# Patient Record
Sex: Female | Born: 1975 | Race: Black or African American | Hispanic: No | Marital: Single | Smoking: Never smoker
Health system: Southern US, Community
[De-identification: ages and names within clinical notes are randomized; demographics above are authoritative.]

## PROBLEM LIST (undated history)

## (undated) DIAGNOSIS — T7840XA Allergy, unspecified, initial encounter: Secondary | ICD-10-CM

## (undated) DIAGNOSIS — U071 COVID-19: Secondary | ICD-10-CM

## (undated) DIAGNOSIS — J45909 Unspecified asthma, uncomplicated: Secondary | ICD-10-CM

## (undated) DIAGNOSIS — R220 Localized swelling, mass and lump, head: Secondary | ICD-10-CM

## (undated) DIAGNOSIS — D649 Anemia, unspecified: Secondary | ICD-10-CM

## (undated) DIAGNOSIS — G709 Myoneural disorder, unspecified: Secondary | ICD-10-CM

## (undated) HISTORY — PX: CHOLECYSTECTOMY: SHX55

## (undated) HISTORY — DX: COVID-19: U07.1

## (undated) HISTORY — DX: Localized swelling, mass and lump, head: R22.0

## (undated) HISTORY — DX: Anemia, unspecified: D64.9

## (undated) HISTORY — DX: Allergy, unspecified, initial encounter: T78.40XA

## (undated) HISTORY — DX: Unspecified asthma, uncomplicated: J45.909

## (undated) HISTORY — PX: ABDOMINAL HYSTERECTOMY: SHX81

## (undated) HISTORY — PX: TUBAL LIGATION: SHX77

## (undated) HISTORY — DX: Myoneural disorder, unspecified: G70.9

---

## 2013-06-30 DIAGNOSIS — G709 Myoneural disorder, unspecified: Secondary | ICD-10-CM

## 2013-06-30 HISTORY — DX: Myoneural disorder, unspecified: G70.9

## 2015-05-17 DIAGNOSIS — B9689 Other specified bacterial agents as the cause of diseases classified elsewhere: Secondary | ICD-10-CM | POA: Insufficient documentation

## 2018-06-24 ENCOUNTER — Ambulatory Visit: Payer: Self-pay | Admitting: Family Medicine

## 2018-07-02 ENCOUNTER — Other Ambulatory Visit (HOSPITAL_COMMUNITY): Payer: Self-pay | Admitting: Orthopaedic Surgery

## 2018-07-02 ENCOUNTER — Encounter: Payer: Self-pay | Admitting: Family Medicine

## 2018-07-02 ENCOUNTER — Ambulatory Visit (INDEPENDENT_AMBULATORY_CARE_PROVIDER_SITE_OTHER): Payer: Medicaid Other | Admitting: Family Medicine

## 2018-07-02 ENCOUNTER — Ambulatory Visit (HOSPITAL_COMMUNITY)
Admission: RE | Admit: 2018-07-02 | Discharge: 2018-07-02 | Disposition: A | Discharge status: Home / Self Care | Payer: Medicaid Other | Source: Ambulatory Visit | Attending: Family Medicine | Admitting: Family Medicine

## 2018-07-02 VITALS — BP 120/73 | HR 73 | Temp 98.6°F | Resp 14 | Ht 67.5 in | Wt 165.0 lb

## 2018-07-02 DIAGNOSIS — Z23 Encounter for immunization: Secondary | ICD-10-CM | POA: Diagnosis not present

## 2018-07-02 DIAGNOSIS — M549 Dorsalgia, unspecified: Secondary | ICD-10-CM | POA: Diagnosis not present

## 2018-07-02 DIAGNOSIS — E663 Overweight: Secondary | ICD-10-CM | POA: Diagnosis not present

## 2018-07-02 DIAGNOSIS — E559 Vitamin D deficiency, unspecified: Secondary | ICD-10-CM

## 2018-07-02 DIAGNOSIS — M419 Scoliosis, unspecified: Secondary | ICD-10-CM

## 2018-07-02 DIAGNOSIS — M545 Low back pain, unspecified: Secondary | ICD-10-CM

## 2018-07-02 DIAGNOSIS — M62838 Other muscle spasm: Secondary | ICD-10-CM | POA: Diagnosis not present

## 2018-07-02 DIAGNOSIS — G8929 Other chronic pain: Secondary | ICD-10-CM | POA: Insufficient documentation

## 2018-07-02 DIAGNOSIS — D500 Iron deficiency anemia secondary to blood loss (chronic): Secondary | ICD-10-CM

## 2018-07-02 DIAGNOSIS — Z114 Encounter for screening for human immunodeficiency virus [HIV]: Secondary | ICD-10-CM

## 2018-07-02 MED ORDER — NAPROXEN 500 MG PO TABS: 500.0000 mg | ORAL_TABLET | Freq: Two times a day (BID) | ORAL | 2 refills | Status: DC

## 2018-07-02 NOTE — Progress Notes (Signed)
Patient [facility]  New Patient Encounter Provider: Lanae Boast, [place]    YQM:[id number]  BMW:[id number]  DOB - Jan 24, 1976  SUBJECTIVE:   Ketina Mars, is a 43 y.o. female who presents to establish care with this clinic.   Current problems/concerns:   Patient states that she has a history of muscle spasms in bilateral legs and hands. Is currently taking flexeril as needed for this.  She also states a history of anemia. Does not consistently take iron due to constipation.  Patient also with a history of low back pain. 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 She states that she has to lift and walk during her shift. Intermittently has tingling of her bilateral lower extremities.   Allergies  Allergen Reactions  . Onion    Past Medical History:  Diagnosis Date  . Anemia   . Neuromuscular disorder ([place]) 2015   bilateral legs and hands- spasms.    Current Outpatient Medications on File Prior to Visit  Medication Sig Dispense Refill  . cyclobenzaprine (FLEXERIL) 10 MG tablet Take 10 mg by mouth at bedtime.    Marland Kitchen ibuprofen (ADVIL,MOTRIN) 200 MG tablet Take 200 mg by mouth every 6 (six) hours as needed.    . ferrous sulfate 325 (65 FE) MG tablet Take 325 mg by mouth daily with breakfast.     No current facility-administered medications on file prior to visit.    Family History  Problem Relation Age of Onset  . Cancer Mother        cervical   . GER disease Mother   . Diabetes Brother   . Ovarian cysts Daughter   . ADD / ADHD Son    Social History   Socioeconomic History  . Marital status: Single    Spouse name: Not on file  . Number of children: 3  . Years of education: Not on file  . Highest education level: 8th grade  Occupational History  . Not on file  Social Needs  . Financial resource strain: Somewhat hard  . Food insecurity:    Worry: Sometimes true    Inability: Sometimes true  .  Transportation needs:    Medical: No    Non-medical: No  Tobacco Use  . Smoking status: Never Smoker  . Smokeless tobacco: Current User    Types: Snuff  Substance and Sexual Activity  . Alcohol use: Yes    Alcohol/week: 3.0 standard drinks    Types: 3 Cans of beer per week    Comment: everyday   . Drug use: Never  . Sexual activity: Yes    Birth control/protection: Post-menopausal  Lifestyle  . Physical activity:    Days per week: 0 days    Minutes per session: 0 min  . Stress: Not on file  Relationships  . Social connections:    Talks on phone: More than three times a week    Gets together: Not on file    Attends religious service: 1 to 4 times per year    Active member of club or organization: Not on file    Attends meetings of clubs or organizations: Not on file    Relationship status: Never married  . Intimate partner violence:    Fear of current or ex partner: No    Emotionally abused: No    Physically abused: No    Forced sexual activity: No  Other Topics Concern  . Not on file  Social History Narrative  . Not on file    Review of Systems  Constitutional: Positive for malaise/fatigue.  HENT: Negative.   Eyes: Negative.   Respiratory: Negative.   Cardiovascular: Negative.   Gastrointestinal: Negative.   Genitourinary: Negative.   Musculoskeletal: Positive for back pain and myalgias.  Skin: Negative.   Neurological: Negative.   Psychiatric/Behavioral: Negative.      OBJECTIVE:    BP 120/73 (BP Location: Left Arm, Patient Position: Sitting, Cuff Size: Normal)   Pulse 73   Temp 98.6 F (37 C) (Oral)   Resp 14   Ht 5' 7.5" (1.715 m)   Wt 165 lb (74.8 kg)   SpO2 100%   BMI 25.46 kg/m   Physical Exam  Constitutional: She is oriented to person, place, and time and well-developed, well-nourished, and in no distress. No distress.  HENT:  Head: Normocephalic and atraumatic.  Eyes: Pupils are equal, round, and reactive to light. Conjunctivae and EOM are  normal.  Neck: Normal range of motion. Neck supple.  Cardiovascular: Normal rate, regular rhythm and intact distal pulses. Exam reveals no gallop and no friction rub.  No murmur heard. Pulmonary/Chest: Effort normal and breath sounds normal. No respiratory distress. She has no wheezes.  Abdominal: Soft. Bowel sounds are normal. There is no abdominal tenderness.  Musculoskeletal: Normal range of motion.        General: No tenderness or edema.     Lumbar back: She exhibits bony tenderness. She exhibits normal range of motion. Deformity: curvature noted.        Back:  Lymphadenopathy:    She has no cervical adenopathy.  Neurological: She is alert and oriented to person, place, and time. Gait normal.  Skin: Skin is warm and dry.  Psychiatric: Mood, memory, affect and judgment normal.  Nursing note and vitals reviewed.    ASSESSMENT/PLAN:  1. Vitamin D deficiency Pending labs. Will adjust medications accordingly.    - VITAMIN D 25 Hydroxy (Vit-D Deficiency, Fractures)  2. Iron deficiency anemia due to chronic blood loss Pending labs. Will adjust medications accordingly.  - Iron, TIBC and Ferritin Panel  3. Overweight (BMI 25.0-29.9)  - Lipid Panel With LDL/HDL Ratio; Future  4. Muscle spasm Pending labs. Will adjust medications accordingly.  - CBC With Differential - Comprehensive metabolic panel - naproxen (NAPROSYN) 500 MG tablet; Take 1 tablet (500 mg total) by mouth 2 (two) times daily with a meal.  Dispense: 30 tablet; Refill: 2  5. Screening for HIV (human immunodeficiency virus) Health maintenance - HIV antibody (with reflex)    Return in about 6 months (around 12/31/2018), or if symptoms worsen or fail to improve.  The patient was given clear instructions to go to ER or return to medical center if symptoms don't improve, worsen or new problems develop. The patient verbalized understanding. The patient was told to call to get lab results if they haven't heard anything  in the next week.     This note has been created with Surveyor, quantity. Any transcriptional errors are unintentional.   Ms. Andr L. Nathaneil Canary, FNP-BC Patient [facility] [address] [place],  [address] [phone]

## 2018-07-02 NOTE — Patient Instructions (Signed)
Safe Sex Practicing safe sex means taking steps before and during sex to reduce your risk of:  Getting an STD (sexually transmitted disease).  Giving your partner an STD.  Unwanted pregnancy. How can I practice safe sex? To practice safe sex:  Limit your sexual partners to only one partner who is having sex with only you.  Avoid using alcohol and recreational drugs before having sex. These substances can affect your judgment.  Before having sex with a new partner: ? Talk to your partner about past partners, past STDs, and drug use. ? You and your partner should be screened for STDs and discuss the results with each other.  Check your body regularly for sores, blisters, rashes, or unusual discharge. If you notice any of these problems, visit your health care provider.  If you have symptoms of an infection or you are being treated for an STD, avoid sexual contact.  While having sex, use a condom. Make sure to: ? Use a condom every time you have vaginal, oral, or anal sex. Both females and males should wear condoms during oral sex. ? Keep condoms in place from the beginning to the end of sexual activity. ? Use a latex condom, if possible. Latex condoms offer the best protection. ? Use only water-based lubricants or oils to lubricate a condom. Using petroleum-based lubricants or oils will weaken the condom and increase the chance that it will break.  See your health care provider for regular screenings, exams, and tests for STDs.  Talk with your health care provider about the form of birth control (contraception) that is best for you.  Get vaccinated against hepatitis B and human papillomavirus (HPV).  If you are at risk of being infected with HIV (human immunodeficiency virus), talk with your health care provider about taking a prescription medicine to prevent HIV infection. You are considered at risk for HIV if: ? You are a man who has sex with other men. ? You are a heterosexual  man or woman who is sexually active with more than one partner. ? You take drugs by injection. ? You are sexually active with a partner who has HIV. This information is not intended to replace advice given to you by your health care provider. Make sure you discuss any questions you have with your health care provider. Document Released: 07/24/2004 Document Revised: 10/31/2015 Document Reviewed: 05/06/2015 Elsevier Interactive Patient Education  2019 [employer]. Leg Cramps Leg cramps occur when one or more muscles tighten and you have no control over this tightening (involuntary muscle contraction). Muscle cramps can develop in any muscle, but the most common place is in the calf muscles of the leg. Those cramps can occur during exercise or when you are at rest. Leg cramps are painful, and they may last for a few seconds to a few minutes. Cramps may return several times before they finally stop. Usually, leg cramps are not caused by a serious medical problem. In many cases, the cause is not known. Some common causes include:  Excessive physical effort (overexertion), such as during intense exercise.  Overuse from repetitive motions, or doing the same thing over and over.  Staying in a certain position for a long period of time.  Improper preparation, form, or technique while performing a sport or an activity.  Dehydration.  Injury.  Side effects of certain medicines.  Abnormally low levels of minerals in your blood (electrolytes), especially potassium and calcium. This could result from: ? Pregnancy. ? Taking diuretic medicines.  Follow these instructions at home: Eating and drinking  Drink enough fluid to keep your urine pale yellow. Staying hydrated may help prevent cramps.  Eat a healthy diet that includes plenty of nutrients to help your muscles function. A healthy diet includes fruits and vegetables, lean protein, whole grains, and low-fat or nonfat dairy products. Managing  pain, stiffness, and swelling      Try massaging, stretching, and relaxing the affected muscle. Do this for several minutes at a time.  If directed, put ice on areas that are sore or painful after a cramp: ? Put ice in a plastic bag. ? Place a towel between your skin and the bag. ? Leave the ice on for 20 minutes, 2-3 times a day.  If directed, apply heat to muscles that are tense or tight. Do this before you exercise, or as often as told by your health care provider. Use the heat source that your health care provider recommends, such as a moist heat pack or a heating pad. ? Place a towel between your skin and the heat source. ? Leave the heat on for 20-30 minutes. ? Remove the heat if your skin turns bright red. This is especially important if you are unable to feel pain, heat, or cold. You may have a greater risk of getting burned.  Try taking hot showers or baths to help relax tight muscles. General instructions  If you are having frequent leg cramps, avoid intense exercise for several days.  Take over-the-counter and prescription medicines only as told by your health care provider.  Keep all follow-up visits as told by your health care provider. This is important. Contact a health care provider if:  Your leg cramps get more severe or more frequent, or they do not improve over time.  Your foot becomes cold, numb, or blue. Summary  Muscle cramps can develop in any muscle, but the most common place is in the calf muscles of the leg.  Leg cramps are painful, and they may last for a few seconds to a few minutes.  Usually, leg cramps are not caused by a serious medical problem. Often, the cause is not known.  Stay hydrated and take over-the-counter and prescription medicines only as told by your health care provider. This information is not intended to replace advice given to you by your health care provider. Make sure you discuss any questions you have with your health care  provider. Document Released: 07/24/2004 Document Revised: 03/26/2017 Document Reviewed: 03/26/2017 Elsevier Interactive Patient Education  2019 [employer]. Iron Deficiency Anemia, Adult Iron-deficiency anemia is when you have a low amount of red blood cells or hemoglobin. This happens because you have too little iron in your body. Hemoglobin carries oxygen to parts of the body. Anemia can cause your body to not get enough oxygen. It may or may not cause symptoms. Follow these instructions at home: Medicines  Take over-the-counter and prescription medicines only as told by your doctor. This includes iron pills (supplements) and vitamins.  If you cannot handle taking iron pills by mouth, ask your doctor about getting iron through: ? A vein (intravenously). ? A shot (injection) into a muscle.  Take iron pills when your stomach is empty. If you cannot handle this, take them with food.  Do not drink milk or take antacids at the same time as your iron pills.  To prevent trouble pooping (constipation), eat fiber or take medicine (stool softener) as told by your doctor. Eating and drinking  Talk with your doctor before changing the foods you eat. He or she may tell you to eat foods that have a lot of iron, such as: ? Liver. ? Lowfat (lean) beef. ? Breads and cereals that have iron added to them (fortified breads and cereals). ? Eggs. ? Dried fruit. ? Dark green, leafy vegetables.  Drink enough fluid to keep your pee (urine) clear or pale yellow.  Eat fresh fruits and vegetables that are high in vitamin C. They help your body to use iron. Foods with a lot of vitamin C include: ? Oranges. ? Peppers. ? Tomatoes. ? Mangoes. General instructions  Return to your normal activities as told by your doctor. Ask your doctor what activities are safe for you.  Keep yourself clean, and keep things clean around you (your surroundings). Anemia can make you get sick more easily.  Keep all  follow-up visits as told by your doctor. This is important. Contact a doctor if:  You feel sick to your stomach (nauseous).  You throw up (vomit).  You feel weak.  You are sweating for no clear reason.  You have trouble pooping, such as: ? Pooping (having a bowel movement) less than 3 times a week. ? Straining to poop. ? Having poop that is hard, dry, or larger than normal. ? Feeling full or bloated. ? Pain in the lower belly. ? Not feeling better after pooping. Get help right away if:  You pass out (faint). If this happens, do not drive yourself to the hospital. Call your local emergency services (911 in the [place]  You have chest pain.  You have shortness of breath that: ? Is very bad. ? Gets worse with physical activity.  You have a fast heartbeat.  You get light-headed when getting up from sitting or lying down. This information is not intended to replace advice given to you by your health care provider. Make sure you discuss any questions you have with your health care provider. Document Released: 07/19/2010 Document Revised: 03/05/2016 Document Reviewed: 03/05/2016 Elsevier Interactive Patient Education  2019 [employer].

## 2018-07-03 LAB — IRON,TIBC AND FERRITIN PANEL
Ferritin: 81 ng/mL (ref 15–150)
Iron Saturation: 32 % (ref 15–55)
Iron: 108 ug/dL (ref 27–159)
Total Iron Binding Capacity: 336 ug/dL (ref 250–450)
UIBC: 228 ug/dL (ref 131–425)

## 2018-07-03 LAB — CBC WITH DIFFERENTIAL
Basophils Absolute: 0.1 10*3/uL (ref 0.0–0.2)
Basos: 1 %
EOS (ABSOLUTE): 0.3 10*3/uL (ref 0.0–0.4)
Eos: 7 %
Hematocrit: 34.1 % (ref 34.0–46.6)
Hemoglobin: 10.9 g/dL — ABNORMAL LOW (ref 11.1–15.9)
Immature Grans (Abs): 0 10*3/uL (ref 0.0–0.1)
Immature Granulocytes: 0 %
Lymphocytes Absolute: 1.9 10*3/uL (ref 0.7–3.1)
Lymphs: 38 %
MCH: 26.9 pg (ref 26.6–33.0)
MCHC: 32 g/dL (ref 31.5–35.7)
MCV: 84 fL (ref 79–97)
Monocytes Absolute: 0.2 10*3/uL (ref 0.1–0.9)
Monocytes: 5 %
Neutrophils Absolute: 2.4 10*3/uL (ref 1.4–7.0)
Neutrophils: 49 %
RBC: 4.05 x10E6/uL (ref 3.77–5.28)
RDW: 12.6 % (ref 12.3–15.4)
WBC: 4.8 10*3/uL (ref 3.4–10.8)

## 2018-07-03 LAB — LIPID PANEL WITH LDL/HDL RATIO
Cholesterol, Total: 204 mg/dL — ABNORMAL HIGH (ref 100–199)
HDL: 94 mg/dL (ref >39)
LDL Calculated: 90 mg/dL (ref 0–99)
LDl/HDL Ratio: 1 ratio (ref 0.0–3.2)
Triglycerides: 102 mg/dL (ref 0–149)
VLDL Cholesterol Cal: 20 mg/dL (ref 5–40)

## 2018-07-03 LAB — COMPREHENSIVE METABOLIC PANEL
ALT: 12 IU/L (ref 0–32)
AST: 15 IU/L (ref 0–40)
Albumin/Globulin Ratio: 1.9 (ref 1.2–2.2)
Albumin: 4.3 g/dL (ref 3.5–5.5)
Alkaline Phosphatase: 40 IU/L (ref 39–117)
BUN/Creatinine Ratio: 17 (ref 9–23)
BUN: 14 mg/dL (ref 6–24)
Bilirubin Total: <0.2 mg/dL (ref 0.0–1.2)
CO2: 23 mmol/L (ref 20–29)
Calcium: 8.7 mg/dL (ref 8.7–10.2)
Chloride: 104 mmol/L (ref 96–106)
Creatinine, Ser: 0.83 mg/dL (ref 0.57–1.00)
GFR calc Af Amer: 100 mL/min/{1.73_m2} (ref >59)
GFR calc non Af Amer: 87 mL/min/{1.73_m2} (ref >59)
Globulin, Total: 2.3 g/dL (ref 1.5–4.5)
Glucose: 84 mg/dL (ref 65–99)
Potassium: 4 mmol/L (ref 3.5–5.2)
Sodium: 141 mmol/L (ref 134–144)
Total Protein: 6.6 g/dL (ref 6.0–8.5)

## 2018-07-03 LAB — HIV ANTIBODY (ROUTINE TESTING W REFLEX): HIV Screen 4th Generation wRfx: NONREACTIVE

## 2018-07-03 LAB — VITAMIN D 25 HYDROXY (VIT D DEFICIENCY, FRACTURES): Vit D, 25-Hydroxy: 6.6 ng/mL — ABNORMAL LOW (ref 30.0–100.0)

## 2018-07-06 ENCOUNTER — Telehealth: Payer: Self-pay

## 2018-07-06 MED ORDER — VITAMIN D (ERGOCALCIFEROL) 1.25 MG (50000 UNIT) PO CAPS: 50000.0000 [IU] | ORAL_CAPSULE | ORAL | 0 refills | Status: AC

## 2018-07-06 NOTE — Addendum Note (Signed)
Addended by: Genelle Bal on: 07/06/2018 12:18 PM   Modules accepted: Orders

## 2018-07-06 NOTE — Telephone Encounter (Signed)
Patient has been notified via my-chart. Thanks!

## 2018-07-06 NOTE — Telephone Encounter (Signed)
I am sending this patient to physical therapy for further evaluation of scoliosis.

## 2018-07-06 NOTE — Telephone Encounter (Signed)
-----   Message from Lanae Boast, [place] sent at 07/06/2018 12:18 PM EST ----- Low Vitamin D. All other labs are stable. I will send Vit D to the pharmacy. After completion of the prescription, patient will need otc vitamin D of 1,000units daily.

## 2018-07-06 NOTE — Addendum Note (Signed)
Addended by: Genelle Bal on: 07/06/2018 01:23 PM   Modules accepted: Orders

## 2018-07-06 NOTE — Telephone Encounter (Signed)
Called, no answer. Left a message for patient to call back to our office. Thanks!  

## 2018-07-20 ENCOUNTER — Other Ambulatory Visit: Payer: Self-pay

## 2018-07-20 ENCOUNTER — Ambulatory Visit: Payer: Medicaid Other | Attending: Orthopaedic Surgery | Admitting: Physical Therapy

## 2018-07-20 ENCOUNTER — Encounter: Payer: Self-pay | Admitting: Physical Therapy

## 2018-07-20 DIAGNOSIS — M545 Low back pain, unspecified: Secondary | ICD-10-CM

## 2018-07-20 DIAGNOSIS — M4126 Other idiopathic scoliosis, lumbar region: Secondary | ICD-10-CM | POA: Diagnosis not present

## 2018-07-20 DIAGNOSIS — M62838 Other muscle spasm: Secondary | ICD-10-CM | POA: Diagnosis not present

## 2018-07-20 DIAGNOSIS — G8929 Other chronic pain: Secondary | ICD-10-CM | POA: Diagnosis not present

## 2018-07-20 NOTE — Therapy (Signed)
[place], [place], [address] Phone: [phone]   Fax:  [phone]  Physical Therapy Evaluation  Patient Details  Name: Melanie Ortiz MRN: [id number] Date of Birth: 1975-07-04 Referring Provider (PT): Lanae Boast, FNP   Encounter Date: 07/20/2018  PT End of Session - 07/20/18 1632    Visit Number  1    Number of Visits  4    Date for PT Re-Evaluation  08/17/18    Authorization Type  Medicaid    PT Start Time  6644    PT Stop Time  1615    PT Time Calculation (min)  45 min    Activity Tolerance  Patient tolerated treatment well    Behavior During Therapy  [facility] for tasks assessed/performed       Past Medical History:  Diagnosis Date  . Anemia   . Neuromuscular disorder ([place]) 2015   bilateral legs and hands- spasms.     Past Surgical History:  Procedure Laterality Date  . ABDOMINAL HYSTERECTOMY     04/25/2015    There were no vitals filed for this visit.   Subjective Assessment - 07/20/18 1531    Subjective  Pt. reports approximately 2 year history of insidious onset mildine lumbar pain. Additionally pt. reports approximately 3 year history of issues with muscle spasms in bilat. thighs and feet exacerbated in particularly with exposure to cold. No LE radiating pain from back but pt. also reports interimittent burning sensation in bilat. feet worse on right side. Pt. had X-rays of lumbar spine 07/02/18 which per report revealed 10 deg lumbar levoscoliosis. Pt. also c/o that her front thigh muscles feel tight with association with muscle spasms.    Pertinent History  2 year history of symptoms with muscle spasm issues as noted above, underlying mild lumbar scoliosis    Limitations  Standing;Walking;House hold activities    How long can you sit comfortably?  1 hour is chair is sufficiently high    How long can you stand comfortably?  1 hour    How long can you walk comfortably?  1 hour    Diagnostic  tests  X-rays    Patient Stated Goals  get back to stop hurting, help muscles loosen up in legs    Currently in Pain?  Yes    Pain Score  5     Pain Location  Back    Pain Orientation  Lower   midline   Pain Descriptors / Indicators  Sharp    Pain Type  Chronic pain    Pain Radiating Towards  pt. reports intermittent burning in left>right foot but local to feet, does not radiate from back    Pain Onset  More than a month ago    Pain Frequency  Intermittent    Aggravating Factors   prolonged walking and standing on hard floor, supine positioning, sitting in low chairs    Pain Relieving Factors  bending/trunk ROM, standing "straight up"    Effect of Pain on Daily Activities  limits tolerance for standing/walking at work, limits positional tolerance for supine positioning, sitting         [facility] PT Assessment - 07/20/18 0001      Assessment   Medical Diagnosis  Chronic LBP, scoliosis, muscle spasm    Referring Provider (PT)  Lanae Boast, FNP    Onset Date/Surgical Date  07/20/16   estimated per pt. report 2 year history of LBP   Hand Dominance  Right  Prior Therapy  none      Precautions   Precautions  None      Restrictions   Weight Bearing Restrictions  No      Balance Screen   Has the patient fallen in the past 6 months  No      [place] residence    Living Arrangements  Children    Type of [place] Access  --   no stairs     Prior Function   Level of Independence  Independent with basic ADLs      Cognition   Overall Cognitive Status  Within Functional Limits for tasks assessed      Sensation   Light Touch  --   grossly intact bilat. L2-S2 dermatomes   Additional Comments  Bilat. patellar and Achilles reflexes 2+/WNL      Posture/Postural Control   Posture Comments  elevated left iliac crest      ROM / Strength   AROM / PROM / Strength  AROM;Strength      AROM   Overall AROM Comments  Hip AROM/PROM  grossly WFL bilat.    AROM Assessment Site  Lumbar    Lumbar Flexion  90    Lumbar Extension  20    Lumbar - Right Side Bend  28    Lumbar - Left Side Bend  30    Lumbar - Right Rotation  WFL    Lumbar - Left Rotation  [facility]      Strength   Strength Assessment Site  Hip;Knee;Ankle    Right/Left Hip  Right;Left    Right Hip Flexion  5/5    Right Hip Extension  4/5    Right Hip External Rotation   4/5    Right Hip Internal Rotation  4+/5    Right Hip ABduction  4+/5    Left Hip Extension  4/5    Left Hip External Rotation  5/5    Left Hip Internal Rotation  5/5    Left Hip ABduction  4+/5    Right/Left Knee  Right;Left    Right Knee Flexion  5/5    Right Knee Extension  4+/5    Left Knee Flexion  5/5    Left Knee Extension  4+/5    Right/Left Ankle  Right;Left    Right Ankle Dorsiflexion  4+/5    Right Ankle Inversion  5/5    Right Ankle Eversion  5/5    Left Ankle Dorsiflexion  5/5    Left Ankle Inversion  5/5    Left Ankle Eversion  4+/5      Flexibility   Soft Tissue Assessment /Muscle Length  --   SLR 80 deg bilat. with mild hamstring tightness     Palpation   Palpation comment  Very tight with associated muscle tenderness bilat. lumbar paraspinals L4-5 region, tight left>right QL      Special Tests   Other special tests  --   SLR (-) bilat., Thomas test (+) for quad tightness bilat.               Objective measurements completed on examination: See above findings.              PT Education - 07/20/18 1630    Education Details  HEP, POC, spine anatomy, potential symptom etiology, roller use for thigh muscles to address muscle tightness    Person(s) Educated  Patient  Methods  Explanation;Demonstration;Verbal cues;Tactile cues;Handout    Comprehension  Verbal cues required;Tactile cues required;Returned demonstration;Verbalized understanding          PT Long Term Goals - 07/20/18 1633      PT LONG TERM GOAL #1   Title  Independent  with HEP    Baseline  no HEP    Time  4    Period  Weeks    Status  New    Target Date  08/17/18      PT LONG TERM GOAL #2   Title  Tolerate supine positioning for periods at least 10-15 minutes to work towards decreased sleep disturbance due to LBP    Baseline  unable/has difficulty tolerating    Time  4    Period  Weeks    Status  New    Target Date  08/17/18      PT LONG TERM GOAL #3   Title  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 presents with 2 year history chronic LBP. Posturally she has elevated left hip crest which suspect could be associated with scoliosis and as a contributing factor to muscle tightness. Otherwise general tightness/spasm lumbar paraspinal muscles. Tight quads but unable otherwise to correlate c/o thigh and feet muscle spasms directly to LBP. Also unclear etiology c/o burning in feet as there are no radiating symptoms from back. No PMH diabetes or other issue that would relate to neuropathy. Pt. would benefit from PT to help improve pain and address associated functional limitations.    History and Personal Factors relevant to plan of care:  2 year history symptoms, unclear etiology muscle spasms and c/o burning in feet    Clinical Presentation  Evolving    Clinical Presentation due to:  unclear etiology LE muscle spasms and burning sensation in feet    Clinical Decision Making  Moderate    Rehab Potential  Fair    Clinical Impairments Affecting Rehab Potential  chronic symptom history    PT Frequency  --   eval + 3 treatment visits   PT Duration  4 weeks    PT Treatment/Interventions  ADLs/Self Care Home Management;Ultrasound;Traction;Moist Heat;Cryotherapy;Electrical Stimulation;Therapeutic activities;Therapeutic exercise;Neuromuscular re-education;Manual techniques;Dry  needling;Taping;Patient/family education    PT Next Visit Plan  Review HEP as needed, add left QL stretch if able, mat-based lumbar ROM and core strengthening, hamstring stretches, STM lumbar paraspinals    PT Home Exercise Plan  child's pose with sidebend to address QL tightness, pelvic tilt, LTR, hip bridge, quad stretch for pt. c/o quad tightness    Consulted and Agree with Plan of Care  Patient       Patient will benefit from skilled therapeutic intervention in order to improve the following deficits and impairments:  Pain, Postural dysfunction, Increased muscle spasms, Impaired flexibility, Difficulty walking, Decreased activity tolerance, Decreased endurance, Decreased range of motion, Decreased strength  Visit Diagnosis: Chronic midline low back pain without sciatica  Other idiopathic scoliosis, lumbar region  Other muscle spasm     Problem List There are no active problems to display for this patient.   Beaulah Dinning, PT, DPT 07/20/18 5:42 PM  [place]  [place], [place], [address] Phone: [phone]   Fax:  [phone]  Name: Melanie Ortiz MRN: [id number] Date of Birth: 12/03/1975

## 2018-07-28 ENCOUNTER — Encounter: Payer: Self-pay | Admitting: Physical Therapy

## 2018-07-28 ENCOUNTER — Ambulatory Visit: Payer: Medicaid Other | Admitting: Physical Therapy

## 2018-07-28 DIAGNOSIS — G8929 Other chronic pain: Secondary | ICD-10-CM

## 2018-07-28 DIAGNOSIS — M545 Low back pain: Principal | ICD-10-CM

## 2018-07-28 DIAGNOSIS — M62838 Other muscle spasm: Secondary | ICD-10-CM | POA: Diagnosis not present

## 2018-07-28 DIAGNOSIS — M4126 Other idiopathic scoliosis, lumbar region: Secondary | ICD-10-CM | POA: Diagnosis not present

## 2018-07-28 NOTE — Patient Instructions (Signed)
Hamstring Stretch, Reclined (Strap, Doorframe)    Lengthen bottom leg on floor. Extend top leg along edge of doorframe or press foot up into yoga strap. Hold for ___3_ breaths. Repeat ___3_ times each leg.  Copyright  VHI. All rights reserved.

## 2018-07-28 NOTE — Therapy (Signed)
[place], [place], [address] Phone: [phone]   Fax:  [phone]  Physical Therapy Treatment  Patient Details  Name: Melanie Ortiz MRN: [id number] Date of Birth: 1975-09-03 Referring Provider (PT): Lanae Boast, FNP   Encounter Date: 07/28/2018  PT End of Session - 07/28/18 0901    Visit Number  2    Number of Visits  4    Date for PT Re-Evaluation  08/17/18    Authorization Type  Medicaid    Authorization Time Period  1/28 to 08/16/18    Authorization - Visit Number  1    Authorization - Number of Visits  3    PT Start Time  0850    PT Stop Time  0930    PT Time Calculation (min)  40 min    Activity Tolerance  Patient tolerated treatment well    Behavior During Therapy  [facility] for tasks assessed/performed       Past Medical History:  Diagnosis Date  . Anemia   . Neuromuscular disorder ([place]) 2015   bilateral legs and hands- spasms.     Past Surgical History:  Procedure Laterality Date  . ABDOMINAL HYSTERECTOMY     04/25/2015    There were no vitals filed for this visit.  Subjective Assessment - 07/28/18 0853    Subjective  I just have to be careful how I move.  No new complaints.  Feet still burn with walking. Showed me how her quads have a bump, divet in them.     Currently in Pain?  Yes    Pain Score  3     Pain Location  Back    Pain Orientation  Lower    Pain Descriptors / Indicators  Aching    Pain Type  Chronic pain    Pain Onset  More than a month ago    Pain Frequency  Intermittent    Aggravating Factors   changing positions, walking, standing     Pain Relieving Factors  repositioning             [facility] Adult PT Treatment/Exercise - 07/28/18 0001      Self-Care   Self-Care  Other Self-Care Comments    Other Self-Care Comments   HEP correction, rationale for stretching       Lumbar Exercises: Stretches   Active Hamstring Stretch  3 reps;30 seconds    Single Knee to  Chest Stretch  3 reps;30 seconds    Lower Trunk Rotation  10 seconds    Lower Trunk Rotation Limitations  x 10     Pelvic Tilt  10 reps    Quad Stretch  3 reps    Quad Stretch Limitations  standing    Gastroc Stretch  2 reps    Gastroc Stretch Limitations  slant board       Lumbar Exercises: Aerobic   Nustep  UE and LE for 7 min L5       Lumbar Exercises: Supine   Bridge  10 reps    Bridge Limitations  pain at first , small ROM         PT Long Term Goals - 07/28/18 0916      PT LONG TERM GOAL #1   Title  Independent with HEP    Status  On-going      PT LONG TERM GOAL #2   Title  Tolerate supine positioning for periods at least 10-15 minutes to work towards decreased sleep  disturbance due to LBP    Status  On-going      PT LONG TERM GOAL #3   Title  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  Worked on effectively stretching to relieve pain and improve ability to squat, work .     PT Treatment/Interventions  ADLs/Self Care Home Management;Ultrasound;Traction;Moist Heat;Cryotherapy;Electrical Stimulation;Therapeutic activities;Therapeutic exercise;Neuromuscular re-education;Manual techniques;Dry needling;Taping;Patient/family education    PT Next Visit Plan  add left QL stretch if able, mat-based lumbar ROM and core strengthening, hamstring stretches, STM lumbar paraspinals    PT Home Exercise Plan  child's pose with sidebend to address QL tightness, pelvic tilt, LTR, hip bridge, quad stretch for pt. c/o quad tightness    Consulted and Agree with Plan of Care  Patient       Patient will benefit from skilled therapeutic intervention in order to improve the following deficits and impairments:  Pain, Postural dysfunction, Increased muscle spasms, Impaired flexibility, Difficulty walking, Decreased activity  tolerance, Decreased endurance, Decreased range of motion, Decreased strength  Visit Diagnosis: Chronic midline low back pain without sciatica  Other idiopathic scoliosis, lumbar region  Other muscle spasm     Problem List There are no active problems to display for this patient.   Melanie Ortiz 07/28/2018, 9:40 AM  [place], [place], [address] Phone: [phone]   Fax:  [phone]  Name: Melanie Ortiz MRN: [id number] Date of Birth: [birth year]  Raeford Razor, PT 07/28/18 9:40 AM Phone: [phone] Fax: [phone]

## 2018-08-03 ENCOUNTER — Encounter: Payer: Self-pay | Admitting: Physical Therapy

## 2018-08-03 ENCOUNTER — Ambulatory Visit: Payer: Medicaid Other | Attending: Orthopaedic Surgery | Admitting: Physical Therapy

## 2018-08-03 DIAGNOSIS — M545 Low back pain: Secondary | ICD-10-CM | POA: Diagnosis not present

## 2018-08-03 DIAGNOSIS — G8929 Other chronic pain: Secondary | ICD-10-CM

## 2018-08-03 DIAGNOSIS — M4126 Other idiopathic scoliosis, lumbar region: Secondary | ICD-10-CM | POA: Diagnosis not present

## 2018-08-03 DIAGNOSIS — M62838 Other muscle spasm: Secondary | ICD-10-CM

## 2018-08-03 NOTE — Therapy (Signed)
[place], [place], [address] Phone: [phone]   Fax:  [phone]  Physical Therapy Treatment  Patient Details  Name: Melanie Ortiz MRN: [id number] Date of Birth: 1975-10-28 Referring Provider (PT): Lanae Boast, FNP   Encounter Date: 08/03/2018  PT End of Session - 08/03/18 1547    Visit Number  3    Number of Visits  4    Date for PT Re-Evaluation  08/17/18    Authorization Type  Medicaid    Authorization Time Period  1/28 to 08/16/18    Authorization - Visit Number  2    Authorization - Number of Visits  3    PT Start Time  1093    PT Stop Time  1624    PT Time Calculation (min)  41 min    Activity Tolerance  Patient tolerated treatment well    Behavior During Therapy  [facility] for tasks assessed/performed       Past Medical History:  Diagnosis Date  . Anemia   . Neuromuscular disorder ([place]) 2015   bilateral legs and hands- spasms.     Past Surgical History:  Procedure Laterality Date  . ABDOMINAL HYSTERECTOMY     04/25/2015    There were no vitals filed for this visit.  Subjective Assessment - 08/03/18 1545    Subjective  Pt. reports sharp pain in lower back last night-she attributes with possible association with sitting posture, pain not as bad with standing.    Currently in Pain?  Yes    Pain Score  3     Pain Location  Back    Pain Orientation  Lower    Pain Descriptors / Indicators  Sharp    Pain Type  Chronic pain   with exacerbation yesterday   Pain Onset  More than a month ago    Pain Frequency  Intermittent    Aggravating Factors   sitting    Pain Relieving Factors  standing, position changes    Effect of Pain on Daily Activities  limits positional tolerance for sitting                       [facility] Adult PT Treatment/Exercise - 08/03/18 0001      Lumbar Exercises: Stretches   Passive Hamstring Stretch  Right;Left;2 reps;30 seconds    Single Knee to Chest  Stretch  Right;Left;3 reps;30 seconds    Double Knee to Chest Stretch  --   x15 AAROM with 55 cm ball   Lower Trunk Rotation  --   x 10 reps ea. way   Piriformis Stretch  Right;Left;3 reps;20 seconds    Gastroc Stretch  3 reps;20 seconds    Gastroc Stretch Limitations  slant board    Other Lumbar Stretch Exercise  Right QL/trunk stretch manually assisted in left sidelying over pillow roll, RLE off edge of mat 3x30 sec      Lumbar Exercises: Aerobic   Nustep  UE/LE x 6 min L6      Lumbar Exercises: Supine   Pelvic Tilt  15 reps    Bent Knee Raise  10 reps    Bent Knee Raise Limitations  with PPT    Bridge  15 reps    Bridge Limitations  partial bridge, able to tolerated without increased pain    Other Supine Lumbar Exercises  hip add. isometric with small ball 3-5 sec x 15 reps      Manual Therapy  Manual Therapy  Soft tissue mobilization    Soft tissue mobilization  STM upper lumbar paraspinals in left sidelying             PT Education - 08/03/18 1548    Education Details  sitting posture, POC    Person(s) Educated  Patient    Methods  Explanation;Demonstration;Verbal cues    Comprehension  Verbalized understanding;Returned demonstration          PT Long Term Goals - 07/28/18 0916      PT LONG TERM GOAL #1   Title  Independent with HEP    Status  On-going      PT LONG TERM GOAL #2   Title  Tolerate supine positioning for periods at least 10-15 minutes to work towards decreased sleep disturbance due to LBP    Status  On-going      PT LONG TERM GOAL #3   Title  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 Suspect postural/muscular contribution to current symptoms-reviewed sitting posture to help address. Expect progress will be gradual given chronicity of symptoms. Still  unclear etiology c/o diffuse cramping symptoms.    Rehab Potential  Fair    Clinical Impairments Affecting Rehab Potential  chronic symptom history    PT Frequency  --   eval + 3 visits   PT Duration  4 weeks    PT Treatment/Interventions  ADLs/Self Care Home Management;Ultrasound;Traction;Moist Heat;Cryotherapy;Electrical Stimulation;Therapeutic activities;Therapeutic exercise;Neuromuscular re-education;Manual techniques;Dry needling;Taping;Patient/family education    PT Next Visit Plan  flexion bias lumbar ROM and core strengthening, stretches, STM lumbar paraspinals    PT Home Exercise Plan  child's pose with sidebend to address QL tightness, pelvic tilt, LTR, hip bridge, quad stretch for pt. c/o quad tightness    Consulted and Agree with Plan of Care  Patient       Patient will benefit from skilled therapeutic intervention in order to improve the following deficits and impairments:  Pain, Postural dysfunction, Increased muscle spasms, Impaired flexibility, Difficulty walking, Decreased activity tolerance, Decreased endurance, Decreased range of motion, Decreased strength  Visit Diagnosis: Chronic midline low back pain without sciatica  Other idiopathic scoliosis, lumbar region  Other muscle spasm     Problem List There are no active problems to display for this patient.   Beaulah Dinning, PT, DPT 08/03/18 4:47 PM  [facility] [address] [place], [place], [address] Phone: [phone]   Fax:  [phone]  Name: Melanie Ortiz MRN: [id number] Date of Birth: 04-30-1976

## 2018-08-11 ENCOUNTER — Encounter: Payer: Self-pay | Admitting: Physical Therapy

## 2018-08-11 ENCOUNTER — Ambulatory Visit: Payer: Medicaid Other | Admitting: Physical Therapy

## 2018-08-11 DIAGNOSIS — G8929 Other chronic pain: Secondary | ICD-10-CM

## 2018-08-11 DIAGNOSIS — M62838 Other muscle spasm: Secondary | ICD-10-CM

## 2018-08-11 DIAGNOSIS — M545 Low back pain: Secondary | ICD-10-CM | POA: Diagnosis not present

## 2018-08-11 DIAGNOSIS — M4126 Other idiopathic scoliosis, lumbar region: Secondary | ICD-10-CM

## 2018-08-11 NOTE — Therapy (Addendum)
[place], [place], [address] Phone: [phone]   Fax:  [phone]  Physical Therapy Treatment/Discharge  Patient Details  Name: Melanie Ortiz MRN: [id number] Date of Birth: [birth year] Referring Provider (PT): Lanae Boast, FNP   Encounter Date: 08/11/2018  PT End of Session - 08/11/18 1254    Visit Number  4    Number of Visits  4    Date for PT Re-Evaluation  08/17/18    Authorization Type  Medicaid    Authorization Time Period  1/28 to 08/16/18    Authorization - Visit Number  3    Authorization - Number of Visits  3    PT Start Time  7124    PT Stop Time  1226    PT Time Calculation (min)  38 min    Activity Tolerance  Patient tolerated treatment well    Behavior During Therapy  [facility] for tasks assessed/performed       Past Medical History:  Diagnosis Date  . Anemia   . Neuromuscular disorder ([place]) 2015   bilateral legs and hands- spasms.     Past Surgical History:  Procedure Laterality Date  . ABDOMINAL HYSTERECTOMY     04/25/2015    There were no vitals filed for this visit.  Subjective Assessment - 08/11/18 1150    Subjective  Pt. rates improvement for LBP at 60% from baseline. She continues to have difficulty tolerating supine positioning but has been able to modify to use cushion under legs (with improved tolerance). No LBP pre-tx. but reports had pain the last 2 days. Pt. reports that she has been experiencing right shoulder pain and is interested in therapy for this. She reports approximately 2 year history of pain but states symptoms have been worse the past week without specific mechanism of injury. Discussed would need PT referral to include shoulder-pt. will follow up with referring provider for assessment of shoulder pain/referral as needed.    Pertinent History  2 year history of symptoms with muscle spasm issues as noted above, underlying mild lumbar scoliosis    Limitations   Standing;Walking;House hold activities    How long can you sit comfortably?  1 hour is chair is sufficiently high    How long can you stand comfortably?  varies, about an hour at work    How long can you walk comfortably?  varies, about an hour at work    Diagnostic tests  X-rays    Patient Stated Goals  get back to stop hurting, help muscles loosen up in legs    Currently in Pain?  Yes    Pain Score  6     Pain Location  Shoulder    Pain Orientation  Right    Pain Descriptors / Indicators  Sharp    Pain Type  --   chronic pain with exacerbation the past week   Pain Onset  --   1 week exacerbation of chronic pain   Pain Frequency  Intermittent    Aggravating Factors   reaching, activity    Pain Relieving Factors  rest    Effect of Pain on Daily Activities  limits ability for reaching activities, lifting         [facility] PT Assessment - 08/11/18 0001      AROM   Lumbar Flexion  90    Lumbar Extension  20    Lumbar - Right Side Bend  26    Lumbar - Left Side Bend  30    Lumbar - Right Rotation  WFL    Lumbar - Left Rotation  [facility]      Strength   Right Hip Flexion  5/5    Right Hip Extension  4+/5    Right Hip External Rotation   5/5    Right Hip Internal Rotation  5/5    Right Hip ABduction  4+/5    Left Hip Flexion  5/5    Left Hip Extension  4+/5    Left Hip External Rotation  5/5    Left Hip Internal Rotation  5/5    Left Hip ABduction  4+/5    Right Knee Flexion  5/5    Right Knee Extension  4+/5    Left Knee Flexion  5/5    Left Knee Extension  5/5    Right Ankle Dorsiflexion  5/5    Right Ankle Inversion  5/5    Right Ankle Eversion  5/5    Left Ankle Dorsiflexion  5/5    Left Ankle Inversion  5/5    Left Ankle Eversion  5/5                   [facility] Adult PT Treatment/Exercise - 08/11/18 0001      Lumbar Exercises: Stretches   Passive Hamstring Stretch  Right;Left;3 reps;30 seconds    Single Knee to Chest Stretch  Right;Left;3 reps;20 seconds       Lumbar Exercises: Aerobic   Nustep  UE/LE L5 x 5 min      Lumbar Exercises: Supine   Pelvic Tilt  15 reps    Bent Knee Raise  15 reps    Bridge  20 reps    Bridge Limitations  legs on bolster    Other Supine Lumbar Exercises  hip add. isometric with ball 3-5 sec holds 2x10    Other Supine Lumbar Exercises  clamshell green band 2x10      Manual Therapy   Manual Therapy  Soft tissue mobilization    Soft tissue mobilization  STM lumbar paraspinals in right sidelying             PT Education - 08/11/18 1253    Education Details  POC, need PT referral to assess shoulder    Person(s) Educated  Patient    Methods  Explanation    Comprehension  Verbalized understanding          PT Long Term Goals - 08/11/18 1301      PT LONG TERM GOAL #1   Title  Independent with HEP    Baseline  met    Time  4    Period  Weeks    Status  Achieved      PT LONG TERM GOAL #2   Title  Tolerate supine positioning for periods at least 10-15 minutes to work towards decreased sleep disturbance due to LBP    Baseline  unable if legs extended but can tolerate if legs are propped/supported with cushion    Time  4    Period  Weeks    Status  Partially Met      PT LONG TERM GOAL #3   Title  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 has attended 4 therapy visits (eval + 3 treatment sessions) with 60% subjective improvement in LBP symptoms which would consider good response given 2 year history of symptoms prior to therapy. Her LE and core strength as well as postural awareness have been improving from baseline. Still unclear etiology of c/o burning sensation in feet and spasms in feet as symptoms due not clinically appear radicular. Given improvement to date as well as further functional limitations to address with therapy plan  continue therapy for LBP, however will await status for PT referral for shoulder and if received re-evaluate to include in plan of care and request new therapy treatment authorization at next appointment.    History and Personal Factors relevant to plan of care:  2 year history symptoms, unclear etiology muscle spasms and c/o burning sensation in feet    Clinical Presentation  Evolving    Clinical Presentation due to:  unclear etiology LE muscle spasms and burning sensation in feet    Clinical Decision Making  Moderate    Rehab Potential  Fair    Clinical Impairments Affecting Rehab Potential  chronic symptom history    PT Frequency  --   eval + 3 tx. visits (will set new plan of care at re-evaluation if receiving PT referral for shoulder)   PT Duration  4 weeks    PT Treatment/Interventions  ADLs/Self Care Home Management;Ultrasound;Traction;Moist Heat;Cryotherapy;Electrical Stimulation;Therapeutic activities;Therapeutic exercise;Neuromuscular re-education;Manual techniques;Dry needling;Taping;Patient/family education    PT Next Visit Plan  flexion bias lumbar ROM and core strengthening, stretches, STM lumbar paraspinals    PT Home Exercise Plan  child's pose with sidebend to address QL tightness, pelvic tilt, LTR, hip bridge, quad stretch for pt. c/o quad tightness    Consulted and Agree with Plan of Care  Patient       Patient will benefit from skilled therapeutic intervention in order to improve the following deficits and impairments:  Pain, Postural dysfunction, Increased muscle spasms, Impaired flexibility, Difficulty walking, Decreased activity tolerance, Decreased endurance, Decreased range of motion, Decreased strength  Visit Diagnosis: Chronic midline low back pain without sciatica  Other idiopathic scoliosis, lumbar region  Other muscle spasm     Problem List There are no active problems to display for this patient.     PHYSICAL THERAPY DISCHARGE SUMMARY  Visits from  Start of Care: 4  Current functional level related to goals / functional outcomes: Status unknown, patient did not return for further therapy after last visit 08/11/18   Remaining deficits: Unknown   Education / Equipment: NA Plan: Patient agrees to discharge.  Patient goals were partially met. Patient is being discharged due to not returning since the last visit.  ?????          Beaulah Dinning, PT, DPT 09/08/18 1:21 PM    [facility] [address] [place], [place], [address] Phone: [phone]   Fax:  [phone]  Name: Melanie Ortiz MRN: [id number] Date of Birth: 31-Jan-1976

## 2018-08-25 ENCOUNTER — Encounter: Payer: Self-pay | Admitting: Family Medicine

## 2018-08-25 ENCOUNTER — Ambulatory Visit (INDEPENDENT_AMBULATORY_CARE_PROVIDER_SITE_OTHER): Payer: Medicaid Other | Admitting: Family Medicine

## 2018-08-25 VITALS — BP 124/77 | HR 64 | Temp 98.0°F | Ht 67.5 in | Wt 166.2 lb

## 2018-08-25 DIAGNOSIS — R220 Localized swelling, mass and lump, head: Secondary | ICD-10-CM | POA: Diagnosis not present

## 2018-08-25 DIAGNOSIS — Z09 Encounter for follow-up examination after completed treatment for conditions other than malignant neoplasm: Secondary | ICD-10-CM

## 2018-08-25 DIAGNOSIS — T7840XA Allergy, unspecified, initial encounter: Secondary | ICD-10-CM | POA: Diagnosis not present

## 2018-08-25 MED ORDER — PREDNISONE 10 MG PO TABS: ORAL_TABLET | ORAL | 0 refills | Status: DC

## 2018-08-25 MED ORDER — METHYLPREDNISOLONE SODIUM SUCC 125 MG IJ SOLR
125.0000 mg | Freq: Once | INTRAMUSCULAR | Status: AC
  Administered 2018-08-25: 125 mg via INTRAMUSCULAR

## 2018-08-25 MED ORDER — LEVOCETIRIZINE DIHYDROCHLORIDE 5 MG PO TABS: 5.0000 mg | ORAL_TABLET | Freq: Every evening | ORAL | 3 refills | Status: DC

## 2018-08-25 NOTE — Progress Notes (Signed)
Patient [facility]  Sick Visit  Subjective:  Patient ID: Melanie Ortiz, female    DOB: 1975/10/20  Age: 43 y.o. MRN: [id number]  CC:  Chief Complaint  Patient presents with  . Facial Swelling    SWOLLEN EYES    HPI Melanie Ortiz is a 43 year old female who presents for a Sick Visit.   Past Medical History:  Diagnosis Date  . Allergic reaction   . Anemia   . Facial swelling   . Neuromuscular disorder ([place]) 2015   bilateral legs and hands- spasms.    Current Status: Since her last office visit, she awoke this morning with facial swelling. States that swelling has improved since then. She does not recall eating, drinking, or inhaling anything different last night before she went to bed. She has not used any new skin care products.   She denies fevers, chills, fatigue, recent infections, weight loss, and night sweats. She has not had any headaches, visual changes, dizziness, and falls. No chest pain, heart palpitations, cough and shortness of breath reported. No reports of GI problems such as nausea, vomiting, diarrhea, and constipation. She has no reports of blood in stools, dysuria and hematuria. No depression or anxiety report. She denies pain today.   Past Surgical History:  Procedure Laterality Date  . ABDOMINAL HYSTERECTOMY     04/25/2015    Family History  Problem Relation Age of Onset  . Cancer Mother        cervical   . GER disease Mother   . Diabetes Brother   . Ovarian cysts Daughter   . ADD / ADHD Son     Social History   Socioeconomic History  . Marital status: Single    Spouse name: Not on file  . Number of children: 3  . Years of education: Not on file  . Highest education level: 8th grade  Occupational History  . Not on file  Social Needs  . Financial resource strain: Somewhat hard  . Food insecurity:    Worry: Sometimes true    Inability: Sometimes true  . Transportation needs:    Medical: No   Non-medical: No  Tobacco Use  . Smoking status: Never Smoker  . Smokeless tobacco: Current User    Types: Snuff  Substance and Sexual Activity  . Alcohol use: Yes    Alcohol/week: 3.0 standard drinks    Types: 3 Cans of beer per week    Comment: everyday   . Drug use: Never  . Sexual activity: Yes    Birth control/protection: Post-menopausal  Lifestyle  . Physical activity:    Days per week: 0 days    Minutes per session: 0 min  . Stress: Not on file  Relationships  . Social connections:    Talks on phone: More than three times a week    Gets together: Not on file    Attends religious service: 1 to 4 times per year    Active member of club or organization: Not on file    Attends meetings of clubs or organizations: Not on file    Relationship status: Never married  . Intimate partner violence:    Fear of current or ex partner: No    Emotionally abused: No    Physically abused: No    Forced sexual activity: No  Other Topics Concern  . Not on file  Social History Narrative  . Not on file    Outpatient Medications Prior  to Visit  Medication Sig Dispense Refill  . cyclobenzaprine (FLEXERIL) 10 MG tablet Take 10 mg by mouth at bedtime.    Marland Kitchen ibuprofen (ADVIL,MOTRIN) 200 MG tablet Take 200 mg by mouth every 6 (six) hours as needed.    . naproxen (NAPROSYN) 500 MG tablet Take 1 tablet (500 mg total) by mouth 2 (two) times daily with a meal. 30 tablet 2  . Vitamin D, Ergocalciferol, (DRISDOL) 1.25 MG (50000 UT) CAPS capsule Take 1 capsule (50,000 Units total) by mouth every 7 (seven) days. 12 capsule 0  . ferrous sulfate 325 (65 FE) MG tablet Take 325 mg by mouth daily with breakfast.     No facility-administered medications prior to visit.     Allergies  Allergen Reactions  . Onion     ROS Review of Systems  Constitutional: Negative.   HENT: Negative.   Eyes: Negative.   Respiratory: Negative.   Cardiovascular: Negative.   Gastrointestinal: Negative.   Endocrine:  Negative.   Genitourinary: Negative.   Musculoskeletal: Negative.   Skin:       Facial swelling.   Allergic/Immunologic: Negative.   Neurological: Negative.   Hematological: Negative.   Psychiatric/Behavioral: Negative.       Objective:    Physical Exam  Constitutional: She is oriented to person, place, and time. She appears well-developed and well-nourished.  HENT:  Head: Normocephalic and atraumatic.  Eyes: Conjunctivae are normal.  Neck: Normal range of motion. Neck supple.  Cardiovascular: Normal rate, regular rhythm, normal heart sounds and intact distal pulses.  Abdominal: Soft. Bowel sounds are normal.  Musculoskeletal: Normal range of motion.  Neurological: She is alert and oriented to person, place, and time. She has normal reflexes.  Skin: Skin is warm and dry. There is erythema.  Facial swelling.   Psychiatric: She has a normal mood and affect. Her behavior is normal. Judgment and thought content normal.    BP 124/77 (BP Location: Right Arm, Patient Position: Sitting, Cuff Size: Small)   Pulse 64   Temp 98 F (36.7 C) (Oral)   Ht 5' 7.5" (1.715 m)   Wt 166 lb 3.2 oz (75.4 kg)   SpO2 100%   BMI 25.65 kg/m  Wt Readings from Last 3 Encounters:  08/25/18 166 lb 3.2 oz (75.4 kg)  07/02/18 165 lb (74.8 kg)     There are no preventive care reminders to display for this patient.  There are no preventive care reminders to display for this patient.  No results found for: TSH Lab Results  Component Value Date   WBC 4.8 07/02/2018   HGB 10.9 (L) 07/02/2018   HCT 34.1 07/02/2018   MCV 84 07/02/2018   Lab Results  Component Value Date   NA 141 07/02/2018   K 4.0 07/02/2018   CO2 23 07/02/2018   GLUCOSE 84 07/02/2018   BUN 14 07/02/2018   CREATININE 0.83 07/02/2018   BILITOT <0.2 07/02/2018   ALKPHOS 40 07/02/2018   AST 15 07/02/2018   ALT 12 07/02/2018   PROT 6.6 07/02/2018   ALBUMIN 4.3 07/02/2018   CALCIUM 8.7 07/02/2018   Lab Results  Component  Value Date   CHOL 204 (H) 07/02/2018   Lab Results  Component Value Date   HDL 94 07/02/2018   Lab Results  Component Value Date   LDLCALC 90 07/02/2018   Lab Results  Component Value Date   TRIG 102 07/02/2018   No results found for: CHOLHDL No results found for: HGBA1C  Assessment &  Plan:   1. Facial swelling     Swelling is improved. Take Prednisone and Xyzal as prescribed. She will monitor and report to office is symptoms do not improve or worsen.  - predniSONE (DELTASONE) 10 MG tablet; Day #1: Take 6 tablets by mouth Day #2: Take 5 tablets by mouth  Day #3: Take 4 tablets by mouth  Day #4: Take 3 tablets by mouth Day #5: Take 2 tablets by mouth Day #6: Take 1 tablet by mouth, then complete.  Dispense: 21 tablet; Refill: 0 - methylPREDNISolone sodium succinate (SOLU-MEDROL) 125 mg/2 mL injection 125 mg - levocetirizine (XYZAL) 5 MG tablet; Take 1 tablet (5 mg total) by mouth every evening.  Dispense: 30 tablet; Refill: 3 - CBC with Differential - Comprehensive metabolic panel    2. Allergic reaction, initial encounter We will initiate Prednisone taper today and Xyzal today. She received Solu-Medrol injection in office today.  - predniSONE (DELTASONE) 10 MG tablet; Day #1: Take 6 tablets by mouth Day #2: Take 5 tablets by mouth  Day #3: Take 4 tablets by mouth  Day #4: Take 3 tablets by mouth Day #5: Take 2 tablets by mouth Day #6: Take 1 tablet by mouth, then complete.  Dispense: 21 tablet; Refill: 0 - methylPREDNISolone sodium succinate (SOLU-MEDROL) 125 mg/2 mL injection 125 mg - levocetirizine (XYZAL) 5 MG tablet; Take 1 tablet (5 mg total) by mouth every evening.  Dispense: 30 tablet; Refill: 3 - CBC with Differential - Comprehensive metabolic panel  3. Follow up She will follow up in 2 days to assess facial swelling. She will follow up in 1 month with Venora Maples, NP.    Meds ordered this encounter  Medications  . predniSONE (DELTASONE) 10 MG tablet     Sig: Day #1: Take 6 tablets by mouth Day #2: Take 5 tablets by mouth  Day #3: Take 4 tablets by mouth  Day #4: Take 3 tablets by mouth Day #5: Take 2 tablets by mouth Day #6: Take 1 tablet by mouth, then complete.    Dispense:  21 tablet    Refill:  0  . methylPREDNISolone sodium succinate (SOLU-MEDROL) 125 mg/2 mL injection 125 mg  . levocetirizine (XYZAL) 5 MG tablet    Sig: Take 1 tablet (5 mg total) by mouth every evening.    Dispense:  30 tablet    Refill:  3    Orders Placed This Encounter  Procedures  . CBC with Differential  . Comprehensive metabolic panel    Referral Orders  No referral(s) requested today    Kathe Becton,  MSN, FNP-C [facility] [place], [place] [address] [phone]   Problem List Items Addressed This Visit    None    Visit Diagnoses    Facial swelling    -  Primary   Relevant Medications   predniSONE (DELTASONE) 10 MG tablet   methylPREDNISolone sodium succinate (SOLU-MEDROL) 125 mg/2 mL injection 125 mg (Completed)   levocetirizine (XYZAL) 5 MG tablet   Other Relevant Orders   CBC with Differential   Comprehensive metabolic panel   Allergic reaction, initial encounter       Relevant Medications   predniSONE (DELTASONE) 10 MG tablet   methylPREDNISolone sodium succinate (SOLU-MEDROL) 125 mg/2 mL injection 125 mg (Completed)   levocetirizine (XYZAL) 5 MG tablet   Other Relevant Orders   CBC with Differential   Comprehensive metabolic panel   Follow up  Meds ordered this encounter  Medications  . predniSONE (DELTASONE) 10 MG tablet    Sig: Day #1: Take 6 tablets by mouth Day #2: Take 5 tablets by mouth  Day #3: Take 4 tablets by mouth  Day #4: Take 3 tablets by mouth Day #5: Take 2 tablets by mouth Day #6: Take 1 tablet by mouth, then complete.    Dispense:  21 tablet    Refill:  0  . methylPREDNISolone sodium succinate (SOLU-MEDROL) 125 mg/2 mL injection 125 mg    . levocetirizine (XYZAL) 5 MG tablet    Sig: Take 1 tablet (5 mg total) by mouth every evening.    Dispense:  30 tablet    Refill:  3    Follow-up: No follow-ups on file.    Azzie Glatter, FNP

## 2018-08-25 NOTE — Patient Instructions (Addendum)
Levocetirizine Oral Tablets What is this medicine? LEVOCETIRIZINE (LEE voe se TIR i zeen) is an antihistamine. This medicine is used to treat or prevent symptoms of allergies. It is also used to help reduce itchy skin rash and hives. This medicine may be used for other purposes; ask your health care provider or pharmacist if you have questions. COMMON BRAND NAME(S): Xyzal, Xyzal Allergy 24 Hour What should I tell my health care provider before I take this medicine? They need to know if you have any of these conditions: -kidney disease -an unusual or allergic reaction to levocetirizine, cetirizine, hydroxyzine, other medicines, foods, dyes, or preservatives -pregnant or trying to get pregnant -breast-feeding How should I use this medicine? Take this medicine by mouth with a glass of water. Take it at night. The tablet may be split in half. Do not chew the tablets. Follow the directions on the prescription label. You can take it with or without food. Do not take more medicine than directed. You may need to take this medicine for several days before your symptoms improve. Talk to your pediatrician regarding the use of this medicine in children. While this drug may be prescribed for children as young as 25 years old for selected conditions, precautions do apply. Overdosage: If you think you have taken too much of this medicine contact a poison control center or emergency room at once. NOTE: This medicine is only for you. Do not share this medicine with others. What if I miss a dose? If you miss a dose, take it as soon as you can. If it is almost time for your next dose, take only that dose. Do not take double or extra doses. What may interact with this medicine? -alcohol -MAOIs like Carbex, Eldepryl, Marplan, Nardil, and Parnate -medicines that cause drowsiness or sleep -other medicines for colds or allergies -ritonavir -theophylline This list may not describe all possible interactions. Give your  health care provider a list of all the medicines, herbs, non-prescription drugs, or dietary supplements you use. Also tell them if you smoke, drink alcohol, or use illegal drugs. Some items may interact with your medicine. What should I watch for while using this medicine? Visit your doctor or health care professional for regular checks on your health. Tell your doctor or healthcare professional if your symptoms do not start to get better or if they get worse. You may get drowsy or dizzy. Do not drive, use machinery, or do anything that needs mental alertness until you know how this medicine affects you. Do not stand or sit up quickly, especially if you are an older patient. This reduces the risk of dizzy or fainting spells. Alcohol may interfere with the effect of this medicine. Avoid alcoholic drinks. Your mouth may get dry. Chewing sugarless gum or sucking hard candy, and drinking plenty of water may help. Contact your doctor if the problem does not go away or is severe. What side effects may I notice from receiving this medicine? Side effects that you should report to your doctor or health care professional as soon as possible: -allergic reactions like skin rash, itching or hives, swelling of the face, lips, or tongue -changes in vision or hearing -fever -trouble passing urine or change in the amount of urine Side effects that usually do not require medical attention (report to your doctor or health care professional if they continue or are bothersome): -cough -dizziness -drowsiness or tiredness -dry mouth -muscle aches This list may not describe all possible side effects. Call  your doctor for medical advice about side effects. You may report side effects to FDA at 1-800-FDA-1088. Where should I keep my medicine? Keep out of the reach of children. Store at room temperature between 15 and 30 degrees C (59 and 86 degrees F). Throw away any unused medicine after the expiration date. NOTE: This  sheet is a summary. It may not cover all possible information. If you have questions about this medicine, talk to your doctor, pharmacist, or health care provider.  2019 Elsevier/Gold Standard (2008-03-08 11:17:47)    Prednisone tablets What is this medicine? PREDNISONE (PRED ni sone) is a corticosteroid. It is commonly used to treat inflammation of the skin, joints, lungs, and other organs. Common conditions treated include asthma, allergies, and arthritis. It is also used for other conditions, such as blood disorders and diseases of the adrenal glands. This medicine may be used for other purposes; ask your health care provider or pharmacist if you have questions. COMMON BRAND NAME(S): Deltasone, Predone, Sterapred, Sterapred DS What should I tell my health care provider before I take this medicine? They need to know if you have any of these conditions: -Cushing's syndrome -diabetes -glaucoma -heart disease -high blood pressure -infection (especially a virus infection such as chickenpox, cold sores, or herpes) -kidney disease -liver disease -mental illness -myasthenia gravis -osteoporosis -seizures -stomach or intestine problems -thyroid disease -an unusual or allergic reaction to lactose, prednisone, other medicines, foods, dyes, or preservatives -pregnant or trying to get pregnant -breast-feeding How should I use this medicine? Take this medicine by mouth with a glass of water. Follow the directions on the prescription label. Take this medicine with food. If you are taking this medicine once a day, take it in the morning. Do not take more medicine than you are told to take. Do not suddenly stop taking your medicine because you may develop a severe reaction. Your doctor will tell you how much medicine to take. If your doctor wants you to stop the medicine, the dose may be slowly lowered over time to avoid any side effects. Talk to your pediatrician regarding the use of this medicine  in children. Special care may be needed. Overdosage: If you think you have taken too much of this medicine contact a poison control center or emergency room at once. NOTE: This medicine is only for you. Do not share this medicine with others. What if I miss a dose? If you miss a dose, take it as soon as you can. If it is almost time for your next dose, talk to your doctor or health care professional. You may need to miss a dose or take an extra dose. Do not take double or extra doses without advice. What may interact with this medicine? Do not take this medicine with any of the following medications: -metyrapone -mifepristone This medicine may also interact with the following medications: -aminoglutethimide -amphotericin B -aspirin and aspirin-like medicines -barbiturates -certain medicines for diabetes, like glipizide or glyburide -cholestyramine -cholinesterase inhibitors -cyclosporine -digoxin -diuretics -ephedrine -female hormones, like estrogens and birth control pills -isoniazid -ketoconazole -NSAIDS, medicines for pain and inflammation, like ibuprofen or naproxen -phenytoin -rifampin -toxoids -vaccines -warfarin This list may not describe all possible interactions. Give your health care provider a list of all the medicines, herbs, non-prescription drugs, or dietary supplements you use. Also tell them if you smoke, drink alcohol, or use illegal drugs. Some items may interact with your medicine. What should I watch for while using this medicine? Visit your doctor or health  care professional for regular checks on your progress. If you are taking this medicine over a prolonged period, carry an identification card with your name and address, the type and dose of your medicine, and your doctor's name and address. This medicine may increase your risk of getting an infection. Tell your doctor or health care professional if you are around anyone with measles or chickenpox, or if you  develop sores or blisters that do not heal properly. If you are going to have surgery, tell your doctor or health care professional that you have taken this medicine within the last twelve months. Ask your doctor or health care professional about your diet. You may need to lower the amount of salt you eat. This medicine may increase blood sugar. Ask your healthcare provider if changes in diet or medicines are needed if you have diabetes. What side effects may I notice from receiving this medicine? Side effects that you should report to your doctor or health care professional as soon as possible: -allergic reactions like skin rash, itching or hives, swelling of the face, lips, or tongue -changes in emotions or moods -changes in vision -depressed mood -eye pain -fever or chills, cough, sore throat, pain or difficulty passing urine -signs and symptoms of high blood sugar such as being more thirsty or hungry or having to urinate more than normal. You may also feel very tired or have blurry vision. -swelling of ankles, feet Side effects that usually do not require medical attention (report to your doctor or health care professional if they continue or are bothersome): -confusion, excitement, restlessness -headache -nausea, vomiting -skin problems, acne, thin and shiny skin -trouble sleeping -weight gain This list may not describe all possible side effects. Call your doctor for medical advice about side effects. You may report side effects to FDA at 1-800-FDA-1088. Where should I keep my medicine? Keep out of the reach of children. Store at room temperature between 15 and 30 degrees C (59 and 86 degrees F). Protect from light. Keep container tightly closed. Throw away any unused medicine after the expiration date. NOTE: This sheet is a summary. It may not cover all possible information. If you have questions about this medicine, talk to your doctor, pharmacist, or health care provider.  2019  Elsevier/Gold Standard (2018-03-16 10:54:22)    Allergies, Adult An allergy means that your body reacts to something that bothers it (allergen). It is not a normal reaction. This can happen from something that you:  Eat.  Breathe in.  Touch. You can have an allergy (be allergic) to:  Outdoor things, like: ? Pollen. ? Grass. ? Weeds.  Indoor things, like: ? Dust. ? Smoke. ? Pet dander.  Foods.  Medicines.  Things that bother your skin, like: ? Detergents. ? Chemicals. ? Latex.  Perfume.  Bugs. An allergy cannot spread from person to person (is not contagious). Follow these instructions at home:         Stay away from things that you know you are allergic to.  If you have allergies to things in the air, wash out your nose each day. Do it with one of these: ? A salt-water (saline) spray. ? A container (neti pot).  Take over-the-counter and prescription medicines only as told by your doctor.  Keep all follow-up visits as told by your doctor. This is important.  If you are at risk for a very bad allergy reaction (anaphylaxis), keep an auto-injector with you all the time. This is called an epinephrine  injection. ? This is pre-measured medicine with a needle. You can put it into your skin by yourself. ? Right after you have a very bad allergy reaction, you or a person with you must give the medicine in less than a few minutes. This is an emergency.  If you have ever had a very bad allergy reaction, wear a medical alert bracelet or necklace. Your very bad allergy should be written on it. Contact a health care provider if:  Your symptoms do not get better with treatment. Get help right away if:  You have symptoms of a very bad allergy reaction. These include: ? A swollen mouth, tongue, or throat. ? Pain or tightness in your chest. ? Trouble breathing. ? Being short of breath. ? Dizziness. ? Fainting. ? Very bad pain in your belly (abdomen). ? Throwing up  (vomiting). ? Watery poop (diarrhea). Summary  An allergy means that your body reacts to something that bothers it (allergen). It is not a normal reaction.  Stay away from things that make your body react.  Take over-the-counter and prescription medicines only as told by your doctor.  If you are at risk for a very bad allergy reaction, carry an auto-injector (epinephrine injection) all the time. Also, wear a medical alert bracelet or necklace so people know about your allergy. This information is not intended to replace advice given to you by your health care provider. Make sure you discuss any questions you have with your health care provider. Document Released: 10/11/2012 Document Revised: 09/29/2016 Document Reviewed: 09/29/2016 Elsevier Interactive Patient Education  2019 [place]. Methylprednisolone Solution for Injection What is this medicine? METHYLPREDNISOLONE (meth ill pred NISS oh lone) is a corticosteroid. It is commonly used to treat inflammation of the skin, joints, lungs, and other organs. Common conditions treated include asthma, allergies, and arthritis. It is also used for other conditions, such as blood disorders and diseases of the adrenal glands. This medicine may be used for other purposes; ask your health care provider or pharmacist if you have questions. COMMON BRAND NAME(S): A-Methapred, Solu-Medrol What should I tell my health care provider before I take this medicine? They need to know if you have any of these conditions: -Cushing's syndrome -eye disease, vision problems -diabetes -glaucoma -heart disease -high blood pressure -infection (especially a virus infection such as chickenpox, cold sores, or herpes) -liver disease -mental illness -myasthenia gravis -osteoporosis -recently received or scheduled to receive a vaccine -seizures -stomach or intestine problems -thyroid disease -an unusual or allergic reaction to lactose, methylprednisolone, other  medicines, foods, dyes, or preservatives -pregnant or trying to get pregnant -breast-feeding How should I use this medicine? This medicine is for injection or infusion into a vein. It is also for injection into a muscle. It is given by a health care professional in a hospital or clinic setting. Talk to your pediatrician regarding the use of this medicine in children. While this drug may be prescribed for selected conditions, precautions do apply. Overdosage: If you think you have taken too much of this medicine contact a poison control center or emergency room at once. NOTE: This medicine is only for you. Do not share this medicine with others. What if I miss a dose? This does not apply. What may interact with this medicine? Do not take this medicine with any of the following medications: -alefacept -echinacea -iopamidol -live virus vaccines -metyrapone -mifepristone This medicine may also interact with the following medications: -amphotericin B -aspirin and aspirin-like medicines -certain antibiotics like erythromycin,  clarithromycin, troleandomycin -certain medicines for diabetes -certain medicines for fungal infection like ketoconazole -certain medicines for seizures like carbamazepine, phenobarbital, phenytoin -certain medicines that treat or prevent blood clots like warfarin -cyclosporine -digoxin -diuretics -female hormones, like estrogens and birth control pills -isoniazid -NSAIDS, medicines for pain and inflammation, like ibuprofen or naproxen -other medicines for myasthenia gravis -rifampin -vaccines This list may not describe all possible interactions. Give your health care provider a list of all the medicines, herbs, non-prescription drugs, or dietary supplements you use. Also tell them if you smoke, drink alcohol, or use illegal drugs. Some items may interact with your medicine. What should I watch for while using this medicine? Tell your doctor or healthcare  professional if your symptoms do not start to get better or if they get worse. Do not stop taking except on your doctor's advice. You may develop a severe reaction. Your doctor will tell you how much medicine to take. Your condition will be monitored carefully while you are receiving this medicine. This medicine may increase your risk of getting an infection. Tell your doctor or health care professional if you are around anyone with measles or chickenpox, or if you develop sores or blisters that do not heal properly. This medicine may affect blood sugar levels. If you have diabetes, check with your doctor or health care professional before you change your diet or the dose of your diabetic medicine. Tell your doctor or health care professional right away if you have any change in your eyesight. Using this medicine for a long time may increase your risk of low bone mass. Talk to your doctor about bone health. What side effects may I notice from receiving this medicine? Side effects that you should report to your doctor or health care professional as soon as possible: -allergic reactions like skin rash, itching or hives, swelling of the face, lips, or tongue -bloody or tarry stools -changes in vision -hallucination, loss of contact with reality -muscle cramps -muscle pain -palpitations -signs and symptoms of high blood sugar such as dizziness; dry mouth; dry skin; fruity breath; nausea; stomach pain; increased hunger or thirst; increased urination -signs and symptoms of infection like fever or chills; cough; sore throat; pain or trouble passing urine -trouble passing urine or change in the amount of urine Side effects that usually do not require medical attention (report to your doctor or health care professional if they continue or are bothersome): -changes in emotions or mood -constipation -diarrhea -excessive hair growth on the face or body -headache -nausea, vomiting -pain, redness, or  irritation at site where injected -trouble sleeping -weight gain This list may not describe all possible side effects. Call your doctor for medical advice about side effects. You may report side effects to FDA at 1-800-FDA-1088. Where should I keep my medicine? This drug is given in a hospital or clinic and will not be stored at home. NOTE: This sheet is a summary. It may not cover all possible information. If you have questions about this medicine, talk to your doctor, pharmacist, or health care provider.  2019 Elsevier/Gold Standard (2015-08-23 16:21:28)

## 2018-08-26 LAB — COMPREHENSIVE METABOLIC PANEL
ALT: 11 IU/L (ref 0–32)
AST: 15 IU/L (ref 0–40)
Albumin/Globulin Ratio: 2 (ref 1.2–2.2)
Albumin: 4.5 g/dL (ref 3.8–4.8)
Alkaline Phosphatase: 37 IU/L — ABNORMAL LOW (ref 39–117)
BUN/Creatinine Ratio: 19 (ref 9–23)
BUN: 15 mg/dL (ref 6–24)
Bilirubin Total: <0.2 mg/dL (ref 0.0–1.2)
CO2: 24 mmol/L (ref 20–29)
Calcium: 9 mg/dL (ref 8.7–10.2)
Chloride: 105 mmol/L (ref 96–106)
Creatinine, Ser: 0.8 mg/dL (ref 0.57–1.00)
GFR calc Af Amer: 104 mL/min/{1.73_m2} (ref >59)
GFR calc non Af Amer: 91 mL/min/{1.73_m2} (ref >59)
Globulin, Total: 2.3 g/dL (ref 1.5–4.5)
Glucose: 86 mg/dL (ref 65–99)
Potassium: 3.9 mmol/L (ref 3.5–5.2)
Sodium: 143 mmol/L (ref 134–144)
Total Protein: 6.8 g/dL (ref 6.0–8.5)

## 2018-08-26 LAB — CBC WITH DIFFERENTIAL/PLATELET
Basophils Absolute: 0.1 10*3/uL (ref 0.0–0.2)
Basos: 1 %
EOS (ABSOLUTE): 0.2 10*3/uL (ref 0.0–0.4)
Eos: 5 %
Hematocrit: 32.9 % — ABNORMAL LOW (ref 34.0–46.6)
Hemoglobin: 10.9 g/dL — ABNORMAL LOW (ref 11.1–15.9)
Immature Grans (Abs): 0 10*3/uL (ref 0.0–0.1)
Immature Granulocytes: 0 %
Lymphocytes Absolute: 2 10*3/uL (ref 0.7–3.1)
Lymphs: 50 %
MCH: 27.1 pg (ref 26.6–33.0)
MCHC: 33.1 g/dL (ref 31.5–35.7)
MCV: 82 fL (ref 79–97)
Monocytes Absolute: 0.3 10*3/uL (ref 0.1–0.9)
Monocytes: 6 %
Neutrophils Absolute: 1.6 10*3/uL (ref 1.4–7.0)
Neutrophils: 38 %
Platelets: 359 10*3/uL (ref 150–450)
RBC: 4.02 x10E6/uL (ref 3.77–5.28)
RDW: 12.6 % (ref 11.7–15.4)
WBC: 4.1 10*3/uL (ref 3.4–10.8)

## 2018-08-27 ENCOUNTER — Ambulatory Visit: Payer: Medicaid Other

## 2018-08-27 DIAGNOSIS — R220 Localized swelling, mass and lump, head: Secondary | ICD-10-CM

## 2018-08-27 NOTE — Progress Notes (Signed)
Patient here to follow up from facial swelling on 08/25/2018. Patient appears well, still has slightly puffiness under eyes. She states is has greatly improved. No other complaints and is still taking medication as prescribed with no issues. She was advised to follow up next week with Lanelle Bal as scheduled Thanks!

## 2018-09-01 ENCOUNTER — Encounter: Payer: Self-pay | Admitting: Family Medicine

## 2018-09-01 ENCOUNTER — Ambulatory Visit (INDEPENDENT_AMBULATORY_CARE_PROVIDER_SITE_OTHER): Payer: Medicaid Other | Admitting: Family Medicine

## 2018-09-01 VITALS — BP 114/68 | HR 76 | Temp 98.0°F | Ht 67.5 in | Wt 166.6 lb

## 2018-09-01 DIAGNOSIS — T7840XD Allergy, unspecified, subsequent encounter: Secondary | ICD-10-CM | POA: Diagnosis not present

## 2018-09-01 DIAGNOSIS — Z09 Encounter for follow-up examination after completed treatment for conditions other than malignant neoplasm: Secondary | ICD-10-CM | POA: Diagnosis not present

## 2018-09-01 DIAGNOSIS — R220 Localized swelling, mass and lump, head: Secondary | ICD-10-CM | POA: Diagnosis not present

## 2018-09-01 NOTE — Progress Notes (Signed)
[id number] 

## 2018-09-01 NOTE — Progress Notes (Signed)
Patient [facility]  Established Patient Office Visit  Subjective:  Patient ID: Melanie Ortiz, female    DOB: 1976/05/08  Age: 43 y.o. MRN: [id number]  CC:  Chief Complaint  Patient presents with  . Follow-up    Swollen eye    HPI Melanie Ortiz is as 43 year old female who presents for follow up.   Past Medical History:  Diagnosis Date  . Allergic reaction   . Anemia   . Facial swelling   . Neuromuscular disorder ([place]) 2015   bilateral legs and hands- spasms.    Current Status: Since her last office visit, she arrives today for follow up of allergic reaction and facial swelling. Facial swelling has resolved. She states that she has not had additional episode. She is still unsure of what caused the incident. She has completed Prednisone taper as prescribed. She continues to take Xyzal daily as prescribed.    She denies fevers, chills, fatigue, recent infections, weight loss, and night sweats. She has not had any headaches, visual changes, dizziness, and falls. No chest pain, heart palpitations, cough and shortness of breath reported. No reports of GI problems such as nausea, vomiting, diarrhea, and constipation. She has no reports of blood in stools, dysuria and hematuria. No depression or anxiety reported. She denies pain today.   Past Surgical History:  Procedure Laterality Date  . ABDOMINAL HYSTERECTOMY     04/25/2015    Family History  Problem Relation Age of Onset  . Cancer Mother        cervical   . GER disease Mother   . Diabetes Brother   . Ovarian cysts Daughter   . ADD / ADHD Son     Social History   Socioeconomic History  . Marital status: Single    Spouse name: Not on file  . Number of children: 3  . Years of education: Not on file  . Highest education level: 8th grade  Occupational History  . Not on file  Social Needs  . Financial resource strain: Somewhat hard  . Food insecurity:    Worry: Sometimes true    Inability: Sometimes true  . Transportation needs:    Medical: No    Non-medical: No  Tobacco Use  . Smoking status: Never Smoker  . Smokeless tobacco: Current User    Types: Snuff  Substance and Sexual Activity  . Alcohol use: Yes    Alcohol/week: 3.0 standard drinks    Types: 3 Cans of beer per week    Comment: everyday   . Drug use: Never  . Sexual activity: Yes    Birth control/protection: Post-menopausal  Lifestyle  . Physical activity:    Days per week: 0 days    Minutes per session: 0 min  . Stress: Not on file  Relationships  . Social connections:    Talks on phone: More than three times a week    Gets together: Not on file    Attends religious service: 1 to 4 times per year    Active member of club or organization: Not on file    Attends meetings of clubs or organizations: Not on file    Relationship status: Never married  . Intimate partner violence:    Fear of current or ex partner: No    Emotionally abused: No    Physically abused: No    Forced sexual activity: No  Other Topics Concern  . Not on file  Social History  Narrative  . Not on file    Outpatient Medications Prior to Visit  Medication Sig Dispense Refill  . cyclobenzaprine (FLEXERIL) 10 MG tablet Take 10 mg by mouth at bedtime.    . ferrous sulfate 325 (65 FE) MG tablet Take 325 mg by mouth daily with breakfast.    . ibuprofen (ADVIL,MOTRIN) 200 MG tablet Take 200 mg by mouth every 6 (six) hours as needed.    Marland Kitchen levocetirizine (XYZAL) 5 MG tablet Take 1 tablet (5 mg total) by mouth every evening. 30 tablet 3  . naproxen (NAPROSYN) 500 MG tablet Take 1 tablet (500 mg total) by mouth 2 (two) times daily with a meal. 30 tablet 2  . Vitamin D, Ergocalciferol, (DRISDOL) 1.25 MG (50000 UT) CAPS capsule Take 1 capsule (50,000 Units total) by mouth every 7 (seven) days. 12 capsule 0  . predniSONE (DELTASONE) 10 MG tablet Day #1: Take 6 tablets by mouth Day #2: Take 5 tablets by mouth  Day #3: Take 4  tablets by mouth  Day #4: Take 3 tablets by mouth Day #5: Take 2 tablets by mouth Day #6: Take 1 tablet by mouth, then complete. 21 tablet 0   No facility-administered medications prior to visit.     Allergies  Allergen Reactions  . Onion     ROS Review of Systems  Constitutional: Negative.   HENT: Negative.   Eyes: Negative.   Respiratory: Negative.   Cardiovascular: Negative.   Gastrointestinal: Negative.   Endocrine: Negative.   Genitourinary: Negative.   Musculoskeletal: Negative.   Skin: Negative.   Allergic/Immunologic: Negative.   Neurological: Negative.   Hematological: Negative.   Psychiatric/Behavioral: Negative.    Objective:    Physical Exam  Constitutional: She is oriented to person, place, and time. She appears well-developed and well-nourished.  HENT:  Head: Normocephalic and atraumatic.  Eyes: Conjunctivae are normal.  Neck: Normal range of motion. Neck supple.  Cardiovascular: Normal rate, regular rhythm, normal heart sounds and intact distal pulses.  Pulmonary/Chest: Effort normal and breath sounds normal.  Abdominal: Soft. Bowel sounds are normal.  Musculoskeletal: Normal range of motion.  Neurological: She is alert and oriented to person, place, and time. She has normal reflexes.  Skin: Skin is warm and dry.  Psychiatric: She has a normal mood and affect. Her behavior is normal. Judgment and thought content normal.  Vitals reviewed.  BP 114/68 (BP Location: Right Arm, Patient Position: Sitting, Cuff Size: Small)   Pulse 76   Temp 98 F (36.7 C) (Oral)   Ht 5' 7.5" (1.715 m)   Wt 166 lb 9.6 oz (75.6 kg)   SpO2 100%   BMI 25.71 kg/m  Wt Readings from Last 3 Encounters:  09/01/18 166 lb 9.6 oz (75.6 kg)  08/25/18 166 lb 3.2 oz (75.4 kg)  07/02/18 165 lb (74.8 kg)     There are no preventive care reminders to display for this patient.  There are no preventive care reminders to display for this patient.  No results found for: TSH Lab  Results  Component Value Date   WBC 4.1 08/25/2018   HGB 10.9 (L) 08/25/2018   HCT 32.9 (L) 08/25/2018   MCV 82 08/25/2018   PLT 359 08/25/2018   Lab Results  Component Value Date   NA 143 08/25/2018   K 3.9 08/25/2018   CO2 24 08/25/2018   GLUCOSE 86 08/25/2018   BUN 15 08/25/2018   CREATININE 0.80 08/25/2018   BILITOT <0.2 08/25/2018   ALKPHOS 37 (  L) 08/25/2018   AST 15 08/25/2018   ALT 11 08/25/2018   PROT 6.8 08/25/2018   ALBUMIN 4.5 08/25/2018   CALCIUM 9.0 08/25/2018   Lab Results  Component Value Date   CHOL 204 (H) 07/02/2018   Lab Results  Component Value Date   HDL 94 07/02/2018   Lab Results  Component Value Date   LDLCALC 90 07/02/2018   Lab Results  Component Value Date   TRIG 102 07/02/2018   No results found for: CHOLHDL No results found for: HGBA1C  Assessment & Plan:   1. Allergic reaction, subsequent encounter Resolved. No reports of recurrence.   2. Facial swelling Resolved. No reports or signs and symptoms of recurrence noted or reported today.   3. Follow up She will follow up 12/2018.  No orders of the defined types were placed in this encounter.   I have already ordered this as a future order in the computer.  Referral Orders  No referral(s) requested today    Kathe Becton,  MSN, FNP-C Patient [place], [place] [address] [phone]   Problem List Items Addressed This Visit      Other   Allergic reaction - Primary   Facial swelling    Other Visit Diagnoses    Follow up          No orders of the defined types were placed in this encounter.   Follow-up: No follow-ups on file.    Azzie Glatter, FNP

## 2018-10-22 ENCOUNTER — Other Ambulatory Visit: Payer: Self-pay

## 2018-10-22 ENCOUNTER — Encounter (HOSPITAL_COMMUNITY): Payer: Self-pay | Admitting: Emergency Medicine

## 2018-10-22 ENCOUNTER — Ambulatory Visit (HOSPITAL_COMMUNITY)
Admission: EM | Admit: 2018-10-22 | Discharge: 2018-10-22 | Disposition: A | Discharge status: Home / Self Care | Payer: Medicaid Other | Attending: Family Medicine | Admitting: Family Medicine

## 2018-10-22 DIAGNOSIS — S161XXA Strain of muscle, fascia and tendon at neck level, initial encounter: Secondary | ICD-10-CM

## 2018-10-22 MED ORDER — PREDNISONE 10 MG (21) PO TBPK: ORAL_TABLET | ORAL | 0 refills | Status: DC

## 2018-10-22 MED ORDER — CYCLOBENZAPRINE HCL 10 MG PO TABS: 10.0000 mg | ORAL_TABLET | Freq: Two times a day (BID) | ORAL | 0 refills | Status: DC | PRN

## 2018-10-22 NOTE — ED Triage Notes (Signed)
Pt presents to [facility] for assessment of right sided neck pain started Monday.  States she woke up to it.  States she has tried Flexeril, Ibuprofen, icy hot and a heating pad to the area without relief.

## 2018-10-22 NOTE — Discharge Instructions (Signed)
Treating you for muscle strain, spasm and nerve inflammation Take the prednisone as prescribed with food Muscle relaxer as needed for muscle spasm Gentle stretching, heat and massage could help Follow up as needed for continued or worsening symptoms

## 2018-10-25 ENCOUNTER — Encounter: Payer: Medicaid Other | Admitting: Family Medicine

## 2018-10-25 ENCOUNTER — Other Ambulatory Visit: Payer: Self-pay

## 2018-10-25 NOTE — ED Provider Notes (Signed)
[facility]    CSN: [id number] Arrival date & time: 10/22/18  1500     History   Chief Complaint Chief Complaint  Patient presents with  . Neck Pain    HPI Melanie Ortiz is a 43 y.o. female.   Pt is a 43 year old female that presents with right-sided neck pain that started 5 days ago.  Reports that she woke up this way.  Denies any injury.  She has been taking 5 mg of Flexeril along with 800 mg of ibuprofen, icy hot and heating pad without relief.  Describes the pain as pulling and sharp.  Denies any associated numbness, tingling. Some radiation into the right arm.   ROS per HPI      Past Medical History:  Diagnosis Date  . Allergic reaction   . Anemia   . Facial swelling   . Neuromuscular disorder ([place]) 2015   bilateral legs and hands- spasms.     Patient Active Problem List   Diagnosis Date Noted  . Facial swelling 08/25/2018  . Allergic reaction 08/25/2018    Past Surgical History:  Procedure Laterality Date  . ABDOMINAL HYSTERECTOMY     04/25/2015    OB History   No obstetric history on file.      Home Medications    Prior to Admission medications   Medication Sig Start Date End Date Taking? Authorizing Provider  cyclobenzaprine (FLEXERIL) 10 MG tablet Take 1 tablet (10 mg total) by mouth 2 (two) times daily as needed for muscle spasms. 10/22/18   Loura Halt A, NP  ferrous sulfate 325 (65 FE) MG tablet Take 325 mg by mouth daily with breakfast.    [provider]  ibuprofen (ADVIL,MOTRIN) 200 MG tablet Take 200 mg by mouth every 6 (six) hours as needed.    [provider]  levocetirizine (XYZAL) 5 MG tablet Take 1 tablet (5 mg total) by mouth every evening. 08/25/18   Azzie Glatter, FNP  naproxen (NAPROSYN) 500 MG tablet Take 1 tablet (500 mg total) by mouth 2 (two) times daily with a meal. 07/02/18   Lanae Boast, FNP  predniSONE (STERAPRED UNI-PAK 21 TAB) 10 MG (21) TBPK tablet 6 tabs for 1 day, then 5 tabs for 1  das, then 4 tabs for 1 day, then 3 tabs for 1 day, 2 tabs for 1 day, then 1 tab for 1 day 10/22/18   Orvan July, NP    Family History Family History  Problem Relation Age of Onset  . Cancer Mother        cervical   . GER disease Mother   . Diabetes Brother   . Ovarian cysts Daughter   . ADD / ADHD Son     Social History Social History   Tobacco Use  . Smoking status: Never Smoker  . Smokeless tobacco: Former Systems developer    Types: Snuff  Substance Use Topics  . Alcohol use: Yes    Alcohol/week: 3.0 standard drinks    Types: 3 Cans of beer per week    Comment: everyday   . Drug use: Never     Allergies   Onion   Review of Systems Review of Systems   Physical Exam Triage Vital Signs ED Triage Vitals  Enc Vitals Group     BP 10/22/18 1516 121/75     Pulse Rate 10/22/18 1516 83     Resp 10/22/18 1516 18     Temp 10/22/18 1516 99.1 F (37.3 C)  Temp Source 10/22/18 1516 Oral     SpO2 10/22/18 1516 100 %     Weight --      Height --      Head Circumference --      Peak Flow --      Pain Score 10/22/18 1517 10     Pain Loc --      Pain Edu? --      Excl. in [place]? --    No data found.  Updated Vital Signs BP 121/75 (BP Location: Left Arm)   Pulse 83   Temp 99.1 F (37.3 C) (Oral)   Resp 18   SpO2 100%   Visual Acuity Right Eye Distance:   Left Eye Distance:   Bilateral Distance:    Right Eye Near:   Left Eye Near:    Bilateral Near:     Physical Exam Vitals signs and nursing note reviewed.  Constitutional:      General: She is not in acute distress.    Appearance: Normal appearance. She is not ill-appearing or toxic-appearing.  Neck:     Musculoskeletal: Muscular tenderness present.     Comments: TTP of the right lateral neck and trapezius with mild swelling. No bruising, erythema or deformity.  Pulmonary:     Effort: Pulmonary effort is normal.  Musculoskeletal: Normal range of motion.  Skin:    General: Skin is warm and dry.   Neurological:     Mental Status: She is alert.  Psychiatric:        Mood and Affect: Mood normal.      UC Treatments / Results  Labs (all labs ordered are listed, but only abnormal results are displayed) Labs Reviewed - No data to display  EKG None  Radiology No results found.  Procedures Procedures (including critical care time)  Medications Ordered in UC Medications - No data to display  Initial Impression / Assessment and Plan / UC Course  I have reviewed the triage vital signs and the nursing notes.  Pertinent labs & imaging results that were available during my care of the patient were reviewed by me and considered in my medical decision making (see chart for details).     Right lateral neck strain Some mild radiculopathy Will have her increase the flexeril from 5mg  to 10 mg Prednisone taper for pain and inflammation.  Gentle stretching, heat, massage.  Follow up as needed for continued or worsening symptoms  Final Clinical Impressions(s) / UC Diagnoses   Final diagnoses:  Acute strain of neck muscle, initial encounter     Discharge Instructions     Treating you for muscle strain, spasm and nerve inflammation Take the prednisone as prescribed with food Muscle relaxer as needed for muscle spasm Gentle stretching, heat and massage could help Follow up as needed for continued or worsening symptoms     ED Prescriptions    Medication Sig Dispense Auth. Provider   cyclobenzaprine (FLEXERIL) 10 MG tablet Take 1 tablet (10 mg total) by mouth 2 (two) times daily as needed for muscle spasms. 20 tablet Dillan Candela A, NP   predniSONE (STERAPRED UNI-PAK 21 TAB) 10 MG (21) TBPK tablet 6 tabs for 1 day, then 5 tabs for 1 das, then 4 tabs for 1 day, then 3 tabs for 1 day, 2 tabs for 1 day, then 1 tab for 1 day 21 tablet Rozanna Box, Abdiel Blackerby A, NP     Controlled Substance Prescriptions [place] Controlled Substance Registry consulted? Not Applicable   Loura Halt  A, NP  10/25/18 1442

## 2019-01-03 ENCOUNTER — Ambulatory Visit: Payer: Medicaid Other | Admitting: Family Medicine

## 2019-01-10 ENCOUNTER — Encounter: Payer: Self-pay | Admitting: Family Medicine

## 2019-01-10 ENCOUNTER — Ambulatory Visit (INDEPENDENT_AMBULATORY_CARE_PROVIDER_SITE_OTHER): Payer: Medicaid Other | Admitting: Family Medicine

## 2019-01-10 ENCOUNTER — Other Ambulatory Visit: Payer: Self-pay

## 2019-01-10 VITALS — BP 115/70 | HR 75 | Temp 98.6°F | Resp 16 | Ht 67.0 in | Wt 173.0 lb

## 2019-01-10 DIAGNOSIS — D500 Iron deficiency anemia secondary to blood loss (chronic): Secondary | ICD-10-CM

## 2019-01-10 DIAGNOSIS — E559 Vitamin D deficiency, unspecified: Secondary | ICD-10-CM

## 2019-01-10 NOTE — Progress Notes (Signed)
  Patient [facility]   Progress Note: General Provider: Lanae Boast, FNP  SUBJECTIVE:   Melanie Ortiz is a 43 y.o. female who  has a past medical history of Allergic reaction, Anemia, Facial swelling, and Neuromuscular disorder ([place]) (2015).. Patient presents today for Follow-up (vitamin D deficiency) and Anemia  Patient presents for routine follow up. Patient reports compliance with all medications.  Patient denies side effects of medications.  She would like to know if she needs to continue with taking iron. She denies problems or concerns at the present time.   Review of Systems  Constitutional: Negative.   HENT: Negative.   Eyes: Negative.   Respiratory: Negative.   Cardiovascular: Negative.   Gastrointestinal: Negative.   Genitourinary: Negative.   Musculoskeletal: Negative.   Skin: Negative.   Neurological: Negative.   Psychiatric/Behavioral: Negative.      OBJECTIVE: BP 115/70 (BP Location: Left Arm, Patient Position: Sitting, Cuff Size: Normal)   Pulse 75   Temp 98.6 F (37 C) (Oral)   Resp 16   Ht 5\' 7"  (1.702 m)   Wt 173 lb (78.5 kg)   SpO2 100%   BMI 27.10 kg/m   Wt Readings from Last 3 Encounters:  01/10/19 173 lb (78.5 kg)  09/01/18 166 lb 9.6 oz (75.6 kg)  08/25/18 166 lb 3.2 oz (75.4 kg)     Physical Exam Vitals signs and nursing note reviewed.  Constitutional:      General: She is not in acute distress.    Appearance: Normal appearance.  HENT:     Head: Normocephalic and atraumatic.  Eyes:     Extraocular Movements: Extraocular movements intact.     Conjunctiva/sclera: Conjunctivae normal.     Pupils: Pupils are equal, round, and reactive to light.  Cardiovascular:     Rate and Rhythm: Normal rate and regular rhythm.     Heart sounds: No murmur.  Pulmonary:     Effort: Pulmonary effort is normal.     Breath sounds: Normal breath sounds.  Musculoskeletal: Normal range of motion.  Skin:    General:  Skin is warm and dry.  Neurological:     Mental Status: She is alert and oriented to person, place, and time.  Psychiatric:        Mood and Affect: Mood normal.        Behavior: Behavior normal.        Thought Content: Thought content normal.        Judgment: Judgment normal.     ASSESSMENT/PLAN:   1. Vitamin D deficiency - Vitamin D, 25-hydroxy  2. Iron deficiency anemia due to chronic blood loss - CBC with Differential - Vitamin D, 25-hydroxy - Iron, TIBC and Ferritin Panel    Return in about 6 months (around 07/13/2019).    The patient was given clear instructions to go to ER or return to medical center if symptoms do not improve, worsen or new problems develop. The patient verbalized understanding and agreed with plan of care.   Ms. Melanie Ortiz. Melanie Canary, FNP-BC Patient [facility] [address] [place], [place] [address] [phone]

## 2019-01-10 NOTE — Patient Instructions (Signed)
Vitamin D Deficiency Vitamin D deficiency is when your body does not have enough vitamin D. Vitamin D is important to your body because:  It helps your body use other minerals.  It helps to keep your bones strong and healthy.  It may help to prevent some diseases.  It helps your heart and other muscles work well. Not getting enough vitamin D can make your bones soft. It can also cause other health problems. What are the causes? This condition may be caused by:  Not eating enough foods that contain vitamin D.  Not getting enough sun.  Having diseases that make it hard for your body to absorb vitamin D.  Having a surgery in which a part of the stomach or a part of the small intestine is removed.  Having kidney disease or liver disease. What increases the risk? You are more likely to get this condition if:  You are older.  You do not spend much time outdoors.  You live in a nursing home.  You have had broken bones.  You have weak or thin bones (osteoporosis).  You have a disease or condition that changes how your body absorbs vitamin D.  You have dark skin.  You take certain medicines.  You are overweight or obese. What are the signs or symptoms?  In mild cases, there may not be any symptoms. If the condition is very bad, symptoms may include: ? Bone pain. ? Muscle pain. ? Falling often. ? Broken bones caused by a minor injury. How is this treated? Treatment may include taking supplements as told by your doctor. Your doctor will tell you what dose is best for you. Supplements may include:  Vitamin D.  Calcium. Follow these instructions at home: Eating and drinking   Eat foods that contain vitamin D, such as: ? Dairy products, cereals, or juices with added vitamin D. Check the label. ? Fish, such as salmon or trout. ? Eggs. ? Oysters. ? Mushrooms. The items listed above may not be a complete list of what you can eat and drink. Contact a dietitian for more  options. General instructions  Take medicines and supplements only as told by your doctor.  Get regular, safe exposure to natural sunlight.  Do not use a tanning bed.  Maintain a healthy weight. Lose weight if needed.  Keep all follow-up visits as told by your doctor. This is important. How is this prevented?  You can get vitamin D by: ? Eating foods that naturally contain vitamin D. ? Eating or drinking products that have vitamin D added to them, such as cereals, juices, and milk. ? Taking vitamin D or a multivitamin that contains vitamin D. ? Being in the sun. Your body makes vitamin D when your skin is exposed to sunlight. Your body changes the sunlight into a form of the vitamin that it can use. Contact a doctor if:  Your symptoms do not go away.  You feel sick to your stomach (nauseous).  You throw up (vomit).  You poop less often than normal, or you have trouble pooping (constipation). Summary  Vitamin D deficiency is when your body does not have enough vitamin D.  Vitamin D helps to keep your bones strong and healthy.  This condition is often treated by taking a supplement.  Your doctor will tell you what dose is best for you. This information is not intended to replace advice given to you by your health care provider. Make sure you discuss any questions you  have with your health care provider. Document Released: 06/05/2011 Document Revised: 02/22/2018 Document Reviewed: 02/22/2018 Elsevier Patient Education  [place]. Iron Deficiency Anemia, Adult Iron-deficiency anemia is when you have a low amount of red blood cells or hemoglobin. This happens because you have too little iron in your body. Hemoglobin carries oxygen to parts of the body. Anemia can cause your body to not get enough oxygen. It may or may not cause symptoms. Follow these instructions at home: Medicines  Take over-the-counter and prescription medicines only as told by your doctor. This  includes iron pills (supplements) and vitamins.  If you cannot handle taking iron pills by mouth, ask your doctor about getting iron through: ? A vein (intravenously). ? A shot (injection) into a muscle.  Take iron pills when your stomach is empty. If you cannot handle this, take them with food.  Do not drink milk or take antacids at the same time as your iron pills.  To prevent trouble pooping (constipation), eat fiber or take medicine (stool softener) as told by your doctor. Eating and drinking   Talk with your doctor before changing the foods you eat. He or she may tell you to eat foods that have a lot of iron, such as: ? Liver. ? Lowfat (lean) beef. ? Breads and cereals that have iron added to them (fortified breads and cereals). ? Eggs. ? Dried fruit. ? Dark green, leafy vegetables.  Drink enough fluid to keep your pee (urine) clear or pale yellow.  Eat fresh fruits and vegetables that are high in vitamin C. They help your body to use iron. Foods with a lot of vitamin C include: ? Oranges. ? Peppers. ? Tomatoes. ? Mangoes. General instructions  Return to your normal activities as told by your doctor. Ask your doctor what activities are safe for you.  Keep yourself clean, and keep things clean around you (your surroundings). Anemia can make you get sick more easily.  Keep all follow-up visits as told by your doctor. This is important. Contact a doctor if:  You feel sick to your stomach (nauseous).  You throw up (vomit).  You feel weak.  You are sweating for no clear reason.  You have trouble pooping, such as: ? Pooping (having a bowel movement) less than 3 times a week. ? Straining to poop. ? Having poop that is hard, dry, or larger than normal. ? Feeling full or bloated. ? Pain in the lower belly. ? Not feeling better after pooping. Get help right away if:  You pass out (faint). If this happens, do not drive yourself to the hospital. Call your local  emergency services (911 in the [place]  You have chest pain.  You have shortness of breath that: ? Is very bad. ? Gets worse with physical activity.  You have a fast heartbeat.  You get light-headed when getting up from sitting or lying down. This information is not intended to replace advice given to you by your health care provider. Make sure you discuss any questions you have with your health care provider. Document Released: 07/19/2010 Document Revised: 05/29/2017 Document Reviewed: 03/05/2016 Elsevier Patient Education  2020 [employer].

## 2019-01-11 LAB — CBC WITH DIFFERENTIAL/PLATELET
Basophils Absolute: 0 10*3/uL (ref 0.0–0.2)
Basos: 1 %
EOS (ABSOLUTE): 0.2 10*3/uL (ref 0.0–0.4)
Eos: 3 %
Hematocrit: 32.4 % — ABNORMAL LOW (ref 34.0–46.6)
Hemoglobin: 10.7 g/dL — ABNORMAL LOW (ref 11.1–15.9)
Immature Grans (Abs): 0 10*3/uL (ref 0.0–0.1)
Immature Granulocytes: 0 %
Lymphocytes Absolute: 1.7 10*3/uL (ref 0.7–3.1)
Lymphs: 35 %
MCH: 26.7 pg (ref 26.6–33.0)
MCHC: 33 g/dL (ref 31.5–35.7)
MCV: 81 fL (ref 79–97)
Monocytes Absolute: 0.3 10*3/uL (ref 0.1–0.9)
Monocytes: 6 %
Neutrophils Absolute: 2.7 10*3/uL (ref 1.4–7.0)
Neutrophils: 55 %
Platelets: 319 10*3/uL (ref 150–450)
RBC: 4.01 x10E6/uL (ref 3.77–5.28)
RDW: 12.8 % (ref 11.7–15.4)
WBC: 4.8 10*3/uL (ref 3.4–10.8)

## 2019-01-11 LAB — IRON,TIBC AND FERRITIN PANEL
Ferritin: 97 ng/mL (ref 15–150)
Iron Saturation: 27 % (ref 15–55)
Iron: 96 ug/dL (ref 27–159)
Total Iron Binding Capacity: 354 ug/dL (ref 250–450)
UIBC: 258 ug/dL (ref 131–425)

## 2019-01-11 LAB — VITAMIN D 25 HYDROXY (VIT D DEFICIENCY, FRACTURES): Vit D, 25-Hydroxy: 13.7 ng/mL — ABNORMAL LOW (ref 30.0–100.0)

## 2019-01-12 ENCOUNTER — Telehealth: Payer: Self-pay

## 2019-01-12 ENCOUNTER — Other Ambulatory Visit: Payer: Self-pay | Admitting: Family Medicine

## 2019-01-12 MED ORDER — VITAMIN D (ERGOCALCIFEROL) 1.25 MG (50000 UNIT) PO CAPS: 50000.0000 [IU] | ORAL_CAPSULE | ORAL | 0 refills | Status: AC

## 2019-01-12 NOTE — Telephone Encounter (Signed)
-----   Message from Lanae Boast, [place] sent at 01/12/2019  9:24 AM EDT ----- Low Vitamin D. All other labs are stable. I will send Vit D to the pharmacy. After completion of the prescription, patient will need otc vitamin D of 1,000units daily. Please remember to keep your follow up appointment. If you have problems, questions or concerns, please make an appointment to discuss. Thanks!

## 2019-01-12 NOTE — Telephone Encounter (Signed)
Called, no answer. No voicemail. Will try later.  

## 2019-01-12 NOTE — Progress Notes (Signed)
Low Vitamin D. All other labs are stable. I will send Vit D to the pharmacy. After completion of the prescription, patient will need otc vitamin D of 1,000units daily. Please remember to keep your follow up appointment. If you have problems, questions or concerns, please make an appointment to discuss. Thanks!

## 2019-01-13 NOTE — Telephone Encounter (Signed)
Called, no answer. No voicemail. Thanks!

## 2019-01-14 NOTE — Telephone Encounter (Signed)
Called no answer left a message

## 2019-01-17 NOTE — Telephone Encounter (Signed)
Unable to reach patient by phone. Message has been sent through my chart. Thanks!

## 2019-03-09 ENCOUNTER — Encounter (HOSPITAL_COMMUNITY): Payer: Self-pay

## 2019-03-09 ENCOUNTER — Encounter (HOSPITAL_COMMUNITY): Payer: Self-pay | Admitting: *Deleted

## 2019-07-25 ENCOUNTER — Encounter: Payer: Self-pay | Admitting: Nurse Practitioner

## 2019-07-25 ENCOUNTER — Other Ambulatory Visit: Payer: Self-pay

## 2019-07-25 ENCOUNTER — Ambulatory Visit (INDEPENDENT_AMBULATORY_CARE_PROVIDER_SITE_OTHER): Payer: Medicaid Other | Admitting: Nurse Practitioner

## 2019-07-25 VITALS — BP 116/77 | HR 70 | Temp 98.6°F | Resp 14 | Ht 67.0 in | Wt 168.0 lb

## 2019-07-25 DIAGNOSIS — D649 Anemia, unspecified: Secondary | ICD-10-CM | POA: Insufficient documentation

## 2019-07-25 DIAGNOSIS — N946 Dysmenorrhea, unspecified: Secondary | ICD-10-CM | POA: Insufficient documentation

## 2019-07-25 DIAGNOSIS — Z0000 Encounter for general adult medical examination without abnormal findings: Secondary | ICD-10-CM | POA: Diagnosis not present

## 2019-07-25 DIAGNOSIS — D509 Iron deficiency anemia, unspecified: Secondary | ICD-10-CM | POA: Diagnosis not present

## 2019-07-25 DIAGNOSIS — M419 Scoliosis, unspecified: Secondary | ICD-10-CM

## 2019-07-25 DIAGNOSIS — T7840XD Allergy, unspecified, subsequent encounter: Secondary | ICD-10-CM | POA: Diagnosis not present

## 2019-07-25 DIAGNOSIS — D259 Leiomyoma of uterus, unspecified: Secondary | ICD-10-CM | POA: Insufficient documentation

## 2019-07-25 DIAGNOSIS — N920 Excessive and frequent menstruation with regular cycle: Secondary | ICD-10-CM | POA: Insufficient documentation

## 2019-07-25 MED ORDER — MONTELUKAST SODIUM 10 MG PO TABS: 10.0000 mg | ORAL_TABLET | Freq: Every day | ORAL | 3 refills | Status: DC

## 2019-07-25 MED ORDER — IBUPROFEN 800 MG PO TABS: 800.0000 mg | ORAL_TABLET | Freq: Three times a day (TID) | ORAL | 2 refills | Status: DC | PRN

## 2019-07-25 NOTE — Patient Instructions (Signed)
Scoliosis  Scoliosis is a condition in which the spine curves sideways. Normally, the spine does not curve side-to-side (laterally). With scoliosis, the spine may curve to the left, to the right, or in both directions. The curve of the spine is measured by angles in degrees. Scoliosis can affect people at any age, but it is more common among children and adolescents. What are the causes? The cause of scoliosis is not always known. It may be caused by:  A birth defect.  A disease that can cause problems in the muscles or imbalance of the body, such as cerebral palsy or muscular dystrophy. What are the signs or symptoms? This condition may not cause any symptoms. If you do have symptoms, they may include:  Leaning to one side.  Sunken chest and uneven shoulders.  One side of the body being different or larger than the other side (asymmetry).  An abnormal curve in the back.  Pain, which may limit physical activity.  Shortness of breath.  Bowel or bladder control problems, such as not knowing when you have to go. This can be a sign of nerve damage. How is this diagnosed? This condition is diagnosed based on:  Your medical history.  Your symptoms.  A physical exam. This may include: ? Examining your nerves, muscles, and reflexes (neurological exam). ? Testing the movement of your spine (range of motion study).  Imaging tests, such as: ? X-rays. ? MRI. How is this treated? Treatment for this condition depends on the severity of the symptoms. Treatment may include:  Observation to make sure that your scoliosis does not get worse (progress). You may need to have regular visits with your health care provider.  A back brace to prevent scoliosis from progressing. This may be needed during times of fast growth (growth spurts), such as during adolescence.  Medicine to help relieve pain.  Physical therapy.  Surgery. Follow these instructions at home: If you have a brace:   Wear the brace as told by your health care provider. Remove it only as told by your health care provider.  Loosen the brace if your fingers or toes tingle, become numb, or turn cold and blue.  Keep the brace clean.  If the brace is not waterproof: ? Do not let it get wet. ? Cover it with a watertight covering when you take a bath or a shower. General instructions  Take over-the-counter and prescription medicines only as told by your health care provider.  Donot drive or use heavy machinery while taking prescription pain medicine.  If physical therapy was prescribed, do exercises as instructed.  Before starting any new sports or physical activities, ask your health care provider whether they are safe for you.  Keep all follow-up visits as told by your health care provider. This is important. Contact a health care provider if you have:  Problems with your back brace, such as skin irritation or discomfort.  Back pain that does not get better with medicine. Get help right away if:  Your legs feel weak.  You cannot move your legs.  You cannot control when you urinate or pass stool (loss of bladder or bowel control). Summary  Scoliosis is a condition of having a spine that curves sideways. The spine may curve to the left, to the right, or in both directions.  This condition may be caused by birth defects or diseases that affect muscles and body balance.  Follow your health care provider's instructions about wearing a brace, doing physical  activities, and keeping follow-up visits. °This information is not intended to replace advice given to you by your health care provider. Make sure you discuss any questions you have with your health care provider. °Document Revised: 11/16/2017 Document Reviewed: 10/01/2017 °Elsevier Patient Education © 2020 [employer] ° °

## 2019-07-25 NOTE — Progress Notes (Signed)
Established Patient Office Visit  Subjective:  Patient ID: Melanie Ortiz, female    DOB: Sep 30, 1975  Age: 44 y.o. MRN: [id number]  CC:  Chief Complaint  Patient presents with  . Follow-up    vitamin D    . Anemia  . Back Pain    after work and at night mainly   . Allergies    thinks allergy medication should be adjusted     HPI Melanie Ortiz presents for follow up. She has a history of anemia and allergic rhinitis. She admits that she is having a lot of pressure on her back.  This is worse at night.  She is using over-the-counter ibuprofen with not much relief.  She states she was diagnosed with scoliosis.  She does have a back brace however does not use it because it is hard.  She works daily standing for 8 hours.  She does have some numbness and tingling mostly in the back of her calf and the bottom of her feet.  Sometimes the pain is in her hips.  She denies any recent physical therapy.   She has allergic rhinitis and is currently on Xyzal 5 mg nightly.  She does not feel like this is effective.  She is having increased drainage both day and night.  She has been on this for a while.    Past Medical History:  Diagnosis Date  . Allergic reaction   . Anemia   . Facial swelling   . Neuromuscular disorder ([place]) 2015   bilateral legs and hands- spasms.     Past Surgical History:  Procedure Laterality Date  . ABDOMINAL HYSTERECTOMY     04/25/2015    Family History  Problem Relation Age of Onset  . Cancer Mother        cervical   . GER disease Mother   . Diabetes Brother   . Ovarian cysts Daughter   . ADD / ADHD Son     Social History   Socioeconomic History  . Marital status: Single    Spouse name: Not on file  . Number of children: 3  . Years of education: Not on file  . Highest education level: 8th grade  Occupational History  . Not on file  Tobacco Use  . Smoking status: Never Smoker  . Smokeless tobacco: Former Systems developer    Types: Snuff  Substance and Sexual  Activity  . Alcohol use: Yes    Alcohol/week: 3.0 standard drinks    Types: 3 Cans of beer per week    Comment: everyday   . Drug use: Never  . Sexual activity: Yes    Birth control/protection: Post-menopausal  Other Topics Concern  . Not on file  Social History Narrative  . Not on file   Social Determinants of Health   Financial Resource Strain:   . Difficulty of Paying Living Expenses: Not on file  Food Insecurity:   . Worried About Charity fundraiser in the Last Year: Not on file  . Ran Out of Food in the Last Year: Not on file  Transportation Needs:   . Lack of Transportation (Medical): Not on file  . Lack of Transportation (Non-Medical): Not on file  Physical Activity:   . Days of Exercise per Week: Not on file  . Minutes of Exercise per Session: Not on file  Stress:   . Feeling of Stress : Not on file  Social Connections:   . Frequency of Communication with Friends and Family: Not  on file  . Frequency of Social Gatherings with Friends and Family: Not on file  . Attends Religious Services: Not on file  . Active Member of Clubs or Organizations: Not on file  . Attends Archivist Meetings: Not on file  . Marital Status: Not on file  Intimate Partner Violence:   . Fear of Current or Ex-Partner: Not on file  . Emotionally Abused: Not on file  . Physically Abused: Not on file  . Sexually Abused: Not on file    Outpatient Medications Prior to Visit  Medication Sig Dispense Refill  . cholecalciferol (VITAMIN D3) 25 MCG (1000 UT) tablet Take 1,000 Units by mouth daily.    . cyclobenzaprine (FLEXERIL) 10 MG tablet Take 1 tablet (10 mg total) by mouth 2 (two) times daily as needed for muscle spasms. 20 tablet 0  . ferrous sulfate 325 (65 FE) MG tablet Take 325 mg by mouth daily with breakfast.    . levocetirizine (XYZAL) 5 MG tablet Take 1 tablet (5 mg total) by mouth every evening. 30 tablet 3   No facility-administered medications prior to visit.     Allergies  Allergen Reactions  . Onion     ROS Review of Systems  Constitutional: Negative.   HENT: Positive for postnasal drip and sneezing.   Eyes: Negative.   Respiratory: Negative.   Cardiovascular: Negative.   Gastrointestinal: Negative.   Endocrine: Negative.   Genitourinary: Negative.   Musculoskeletal: Positive for back pain.       Scoliosis   Skin: Negative.   Allergic/Immunologic: Negative.   Neurological: Positive for numbness.  Hematological: Negative.   Psychiatric/Behavioral: Negative.       Objective:    Physical Exam  Constitutional: She is oriented to person, place, and time. She appears well-developed and well-nourished.  HENT:  Head: Normocephalic.  Cardiovascular: Normal rate and regular rhythm.  Pulmonary/Chest: Effort normal.  Abdominal: Soft. Bowel sounds are normal.  Musculoskeletal:        General: Normal range of motion.     Cervical back: Normal range of motion and neck supple.  Neurological: She is alert and oriented to person, place, and time.  Skin: Skin is warm and dry.  Psychiatric: She has a normal mood and affect. Her behavior is normal. Judgment and thought content normal.    BP 116/77 (BP Location: Right Arm, Patient Position: Sitting, Cuff Size: Normal)   Pulse 70   Temp 98.6 F (37 C) (Oral)   Resp 14   Ht 5\' 7"  (1.702 m)   Wt 168 lb (76.2 kg)   SpO2 100%   BMI 26.31 kg/m  Wt Readings from Last 3 Encounters:  07/25/19 168 lb (76.2 kg)  01/10/19 173 lb (78.5 kg)  09/01/18 166 lb 9.6 oz (75.6 kg)     There are no preventive care reminders to display for this patient.  There are no preventive care reminders to display for this patient.  No results found for: TSH Lab Results  Component Value Date   WBC 4.8 01/10/2019   HGB 10.7 (L) 01/10/2019   HCT 32.4 (L) 01/10/2019   MCV 81 01/10/2019   PLT 319 01/10/2019   Lab Results  Component Value Date   NA 143 08/25/2018   K 3.9 08/25/2018   CO2 24 08/25/2018    GLUCOSE 86 08/25/2018   BUN 15 08/25/2018   CREATININE 0.80 08/25/2018   BILITOT <0.2 08/25/2018   ALKPHOS 37 (L) 08/25/2018   AST 15 08/25/2018  ALT 11 08/25/2018   PROT 6.8 08/25/2018   ALBUMIN 4.5 08/25/2018   CALCIUM 9.0 08/25/2018   Lab Results  Component Value Date   CHOL 204 (H) 07/02/2018   Lab Results  Component Value Date   HDL 94 07/02/2018   Lab Results  Component Value Date   LDLCALC 90 07/02/2018   Lab Results  Component Value Date   TRIG 102 07/02/2018   No results found for: CHOLHDL No results found for: HGBA1C    Assessment & Plan:   Problem List Items Addressed This Visit      High   Allergic reaction   Anemia   Relevant Orders   CBC with Differential/Platelet   Comprehensive metabolic panel   Iron    Other Visit Diagnoses    Scoliosis of lumbosacral spine, unspecified scoliosis type    -  Primary   Encourage patient to back brace while standing for 7 hours daily Ibuprofen 800 mg nightly and as needed   Healthcare maintenance       Relevant Orders   Lipid Panel      Meds ordered this encounter  Medications  . ibuprofen (ADVIL) 800 MG tablet    Sig: Take 1 tablet (800 mg total) by mouth every 8 (eight) hours as needed for moderate pain. Most last 7 days.    Dispense:  30 tablet    Refill:  2    For acute post-operative pain. Not to be refilled.  Most last 7 days.    Order Specific Question:   Supervising Provider    Answer:   Tresa Garter [id number]  . montelukast (SINGULAIR) 10 MG tablet    Sig: Take 1 tablet (10 mg total) by mouth at bedtime.    Dispense:  30 tablet    Refill:  3    Order Specific Question:   Supervising Provider    Answer:   Tresa Garter [id number]    Follow-up: Return in about 3 months (around 10/23/2019).    Vevelyn Francois, NP

## 2019-07-26 LAB — CBC WITH DIFFERENTIAL/PLATELET
Basophils Absolute: 0 10*3/uL (ref 0.0–0.2)
Basos: 1 %
EOS (ABSOLUTE): 0.2 10*3/uL (ref 0.0–0.4)
Eos: 5 %
Hematocrit: 35.7 % (ref 34.0–46.6)
Hemoglobin: 11.6 g/dL (ref 11.1–15.9)
Immature Grans (Abs): 0 10*3/uL (ref 0.0–0.1)
Immature Granulocytes: 0 %
Lymphocytes Absolute: 1.6 10*3/uL (ref 0.7–3.1)
Lymphs: 38 %
MCH: 26.4 pg — ABNORMAL LOW (ref 26.6–33.0)
MCHC: 32.5 g/dL (ref 31.5–35.7)
MCV: 81 fL (ref 79–97)
Monocytes Absolute: 0.3 10*3/uL (ref 0.1–0.9)
Monocytes: 7 %
Neutrophils Absolute: 2.1 10*3/uL (ref 1.4–7.0)
Neutrophils: 49 %
Platelets: 299 10*3/uL (ref 150–450)
RBC: 4.39 x10E6/uL (ref 3.77–5.28)
RDW: 12.7 % (ref 11.7–15.4)
WBC: 4.2 10*3/uL (ref 3.4–10.8)

## 2019-07-26 LAB — LIPID PANEL
Chol/HDL Ratio: 2 ratio (ref 0.0–4.4)
Cholesterol, Total: 204 mg/dL — ABNORMAL HIGH (ref 100–199)
HDL: 100 mg/dL (ref >39)
LDL Chol Calc (NIH): 93 mg/dL (ref 0–99)
Triglycerides: 61 mg/dL (ref 0–149)
VLDL Cholesterol Cal: 11 mg/dL (ref 5–40)

## 2019-07-26 LAB — IRON: Iron: 90 ug/dL (ref 27–159)

## 2019-07-27 LAB — COMPREHENSIVE METABOLIC PANEL
ALT: 9 IU/L (ref 0–32)
AST: 13 IU/L (ref 0–40)
Albumin/Globulin Ratio: 1.8 (ref 1.2–2.2)
Albumin: 4.3 g/dL (ref 3.8–4.8)
Alkaline Phosphatase: 44 IU/L (ref 39–117)
BUN/Creatinine Ratio: 21 (ref 9–23)
BUN: 15 mg/dL (ref 6–24)
Bilirubin Total: 0.2 mg/dL (ref 0.0–1.2)
CO2: 20 mmol/L (ref 20–29)
Calcium: 9.1 mg/dL (ref 8.7–10.2)
Chloride: 103 mmol/L (ref 96–106)
Creatinine, Ser: 0.72 mg/dL (ref 0.57–1.00)
GFR calc Af Amer: 118 mL/min/{1.73_m2} (ref >59)
GFR calc non Af Amer: 102 mL/min/{1.73_m2} (ref >59)
Globulin, Total: 2.4 g/dL (ref 1.5–4.5)
Glucose: 91 mg/dL (ref 65–99)
Potassium: 3.7 mmol/L (ref 3.5–5.2)
Sodium: 139 mmol/L (ref 134–144)
Total Protein: 6.7 g/dL (ref 6.0–8.5)

## 2019-07-27 LAB — SPECIMEN STATUS REPORT

## 2019-10-24 ENCOUNTER — Ambulatory Visit: Payer: Medicaid Other | Admitting: Nurse Practitioner

## 2019-10-31 ENCOUNTER — Encounter: Payer: Self-pay | Admitting: Nurse Practitioner

## 2019-10-31 ENCOUNTER — Ambulatory Visit (INDEPENDENT_AMBULATORY_CARE_PROVIDER_SITE_OTHER): Payer: Medicaid Other | Admitting: Nurse Practitioner

## 2019-10-31 ENCOUNTER — Other Ambulatory Visit: Payer: Self-pay

## 2019-10-31 VITALS — BP 118/70 | HR 72 | Ht 67.0 in | Wt 167.0 lb

## 2019-10-31 DIAGNOSIS — M419 Scoliosis, unspecified: Secondary | ICD-10-CM | POA: Diagnosis not present

## 2019-10-31 DIAGNOSIS — J301 Allergic rhinitis due to pollen: Secondary | ICD-10-CM

## 2019-10-31 DIAGNOSIS — M542 Cervicalgia: Secondary | ICD-10-CM | POA: Diagnosis not present

## 2019-10-31 DIAGNOSIS — D509 Iron deficiency anemia, unspecified: Secondary | ICD-10-CM

## 2019-10-31 DIAGNOSIS — Z0000 Encounter for general adult medical examination without abnormal findings: Secondary | ICD-10-CM | POA: Diagnosis not present

## 2019-10-31 MED ORDER — IBUPROFEN 800 MG PO TABS: 800.0000 mg | ORAL_TABLET | Freq: Three times a day (TID) | ORAL | 3 refills | Status: DC | PRN

## 2019-10-31 NOTE — Progress Notes (Signed)
[address] [place], [place]  [address] Phone:  [phone]   Fax:  [phone]   Established Patient Office Visit  Subjective:  Patient ID: Melanie Ortiz, female    DOB: 03-13-1976  Age: 44 y.o. MRN: [id number]  CC:  Chief Complaint  Patient presents with  . Follow-up    3 month follow up  for scoliosis, having sharp pain neck causing her to have headaches , taking medications, wearing back brace helps with pain     HPI Melanie Ortiz presents for follow up. She  has a past medical history of Allergic reaction, Anemia, Facial swelling, and Neuromuscular disorder ([place]) (2015).   Neck Pain Patient complains of neck pain and stiffness. She is not having pain today but she is just getting over a headache. Event that precipitated these symptoms: none known. Onset of symptoms a few months ago, and have been has on and off since that time. Current symptoms are stiffness in her neck that caused a headache.. Patient denies numbness, tingling, burning or weakness.. Patient has had one prior episode of neck pain with stiffiness. She was seen at Urgent Care.. Previous treatments include: medication: muscle relaxant: Cyclobenzaprine and ibuprofen.  He admits that she did purchase new pillows.  She is not able to lie flat.  She has experienced this for some time.  She has a previous diagnosis of scoliosis.  She was having some increased back pain a few months ago.  She was encouraged to get a back brace.  She has done this and admits that it has been very effective when she is at work.  She does have some occasional leg spasms however the cyclobenzaprine is effective for that.  She only uses it as needed secondary to side effects sedation.    Past Medical History:  Diagnosis Date  . Allergic reaction   . Anemia   . Facial swelling   . Neuromuscular disorder ([place]) 2015   bilateral legs and hands- spasms.     Past Surgical History:  Procedure Laterality Date  .  ABDOMINAL HYSTERECTOMY     04/25/2015    Family History  Problem Relation Age of Onset  . Cancer Mother        cervical   . GER disease Mother   . Diabetes Brother   . Ovarian cysts Daughter   . ADD / ADHD Son     Social History   Socioeconomic History  . Marital status: Single    Spouse name: Not on file  . Number of children: 3  . Years of education: Not on file  . Highest education level: 8th grade  Occupational History  . Not on file  Tobacco Use  . Smoking status: Never Smoker  . Smokeless tobacco: Former Systems developer    Types: Snuff  Substance and Sexual Activity  . Alcohol use: Yes    Alcohol/week: 3.0 standard drinks    Types: 3 Cans of beer per week    Comment: everyday   . Drug use: Never  . Sexual activity: Yes    Birth control/protection: Post-menopausal  Other Topics Concern  . Not on file  Social History Narrative  . Not on file   Social Determinants of Health   Financial Resource Strain:   . Difficulty of Paying Living Expenses:   Food Insecurity:   . Worried About Charity fundraiser in the Last Year:   . [place] in the Last Year:  Transportation Needs:   . Film/video editor (Medical):   Marland Kitchen Lack of Transportation (Non-Medical):   Physical Activity:   . Days of Exercise per Week:   . Minutes of Exercise per Session:   Stress:   . Feeling of Stress :   Social Connections:   . Frequency of Communication with Friends and Family:   . Frequency of Social Gatherings with Friends and Family:   . Attends Religious Services:   . Active Member of Clubs or Organizations:   . Attends Archivist Meetings:   Marland Kitchen Marital Status:   Intimate Partner Violence:   . Fear of Current or Ex-Partner:   . Emotionally Abused:   Marland Kitchen Physically Abused:   . Sexually Abused:     Outpatient Medications Prior to Visit  Medication Sig Dispense Refill  . cholecalciferol (VITAMIN D3) 25 MCG (1000 UT) tablet Take 1,000 Units by mouth daily.    .  cyclobenzaprine (FLEXERIL) 10 MG tablet Take 1 tablet (10 mg total) by mouth 2 (two) times daily as needed for muscle spasms. 20 tablet 0  . ferrous sulfate 325 (65 FE) MG tablet Take 325 mg by mouth daily with breakfast.    . montelukast (SINGULAIR) 10 MG tablet Take 1 tablet (10 mg total) by mouth at bedtime. 30 tablet 3  . ibuprofen (ADVIL) 800 MG tablet Take 1 tablet (800 mg total) by mouth every 8 (eight) hours as needed for moderate pain. Most last 7 days. 30 tablet 2  . levocetirizine (XYZAL) 5 MG tablet Take 1 tablet (5 mg total) by mouth every evening. 30 tablet 3   No facility-administered medications prior to visit.    Allergies  Allergen Reactions  . Onion     ROS Review of Systems  Cardiovascular: Positive for chest pain.       Atypical and random chest pain resolves once patient is an active.      Objective:    Physical Exam  Constitutional: She is oriented to person, place, and time. She appears well-developed.  HENT:  Head: Normocephalic.  Eyes: Pupils are equal, round, and reactive to light.  Neck:  Tenderness  Cardiovascular: Normal rate, regular rhythm, normal heart sounds and intact distal pulses.  Pulmonary/Chest: Effort normal and breath sounds normal.  Abdominal: Soft. Bowel sounds are normal.  Musculoskeletal:        General: Normal range of motion.     Cervical back: Normal range of motion.  Neurological: She is alert and oriented to person, place, and time.  Skin: Skin is warm and dry.  Psychiatric: She has a normal mood and affect. Her behavior is normal. Judgment and thought content normal.    BP 118/70 (BP Location: Right Arm, Patient Position: Sitting)   Pulse 72   Ht 5\' 7"  (1.702 m)   Wt 167 lb (75.8 kg)   SpO2 100%   BMI 26.16 kg/m  Wt Readings from Last 3 Encounters:  10/31/19 167 lb (75.8 kg)  07/25/19 168 lb (76.2 kg)  01/10/19 173 lb (78.5 kg)     There are no preventive care reminders to display for this patient.  There are  no preventive care reminders to display for this patient.  No results found for: TSH Lab Results  Component Value Date   WBC 4.2 07/25/2019   HGB 11.6 07/25/2019   HCT 35.7 07/25/2019   MCV 81 07/25/2019   PLT 299 07/25/2019   Lab Results  Component Value Date   NA 139 07/25/2019  K 3.7 07/25/2019   CO2 20 07/25/2019   GLUCOSE 91 07/25/2019   BUN 15 07/25/2019   CREATININE 0.72 07/25/2019   BILITOT 0.2 07/25/2019   ALKPHOS 44 07/25/2019   AST 13 07/25/2019   ALT 9 07/25/2019   PROT 6.7 07/25/2019   ALBUMIN 4.3 07/25/2019   CALCIUM 9.1 07/25/2019   Lab Results  Component Value Date   CHOL 204 (H) 07/25/2019   Lab Results  Component Value Date   HDL 100 07/25/2019   Lab Results  Component Value Date   LDLCALC 93 07/25/2019   Lab Results  Component Value Date   TRIG 61 07/25/2019   Lab Results  Component Value Date   CHOLHDL 2.0 07/25/2019   No results found for: HGBA1C    Assessment & Plan:   Problem List Items Addressed This Visit      High   Anemia   Relevant Orders   CBC with Differential/Platelet   Iron, TIBC and Ferritin Panel    Other Visit Diagnoses    Acute neck pain    -  Primary   Images pending Continue Motrin and cyclobenzaprine.   Relevant Orders   DG Cervical Spine Complete   Scoliosis of lumbosacral spine, unspecified scoliosis type       Continue with current treatment\, anti-inflammatory and muscle relaxant   Non-seasonal allergic rhinitis due to pollen       Refill on Singulair as needed   Healthcare maintenance       Relevant Orders   Comp. Metabolic Panel (12)   Lipid panel      Meds ordered this encounter  Medications  . ibuprofen (ADVIL) 800 MG tablet    Sig: Take 1 tablet (800 mg total) by mouth every 8 (eight) hours as needed for moderate pain. Most last 7 days.    Dispense:  30 tablet    Refill:  3    For acute post-operative pain. Not to be refilled.  Most last 7 days.    Order Specific Question:    Supervising Provider    Answer:   Tresa Garter [id number]    Follow-up: Return for well woman physcial in about 3 months.    Vevelyn Francois, NP

## 2019-10-31 NOTE — Patient Instructions (Signed)

## 2019-11-01 LAB — COMP. METABOLIC PANEL (12)
AST: 19 IU/L (ref 0–40)
Albumin/Globulin Ratio: 1.8 (ref 1.2–2.2)
Albumin: 4.4 g/dL (ref 3.8–4.8)
Alkaline Phosphatase: 57 IU/L (ref 39–117)
BUN/Creatinine Ratio: 12 (ref 9–23)
BUN: 9 mg/dL (ref 6–24)
Bilirubin Total: <0.2 mg/dL (ref 0.0–1.2)
Calcium: 9.1 mg/dL (ref 8.7–10.2)
Chloride: 103 mmol/L (ref 96–106)
Creatinine, Ser: 0.78 mg/dL (ref 0.57–1.00)
GFR calc Af Amer: 107 mL/min/{1.73_m2} (ref >59)
GFR calc non Af Amer: 93 mL/min/{1.73_m2} (ref >59)
Globulin, Total: 2.5 g/dL (ref 1.5–4.5)
Glucose: 80 mg/dL (ref 65–99)
Potassium: 3.6 mmol/L (ref 3.5–5.2)
Sodium: 141 mmol/L (ref 134–144)
Total Protein: 6.9 g/dL (ref 6.0–8.5)

## 2019-11-01 LAB — CBC WITH DIFFERENTIAL/PLATELET
Basophils Absolute: 0.1 10*3/uL (ref 0.0–0.2)
Basos: 1 %
EOS (ABSOLUTE): 0.1 10*3/uL (ref 0.0–0.4)
Eos: 2 %
Hematocrit: 35 % (ref 34.0–46.6)
Hemoglobin: 11.1 g/dL (ref 11.1–15.9)
Immature Grans (Abs): 0 10*3/uL (ref 0.0–0.1)
Immature Granulocytes: 0 %
Lymphocytes Absolute: 1.9 10*3/uL (ref 0.7–3.1)
Lymphs: 40 %
MCH: 26.3 pg — ABNORMAL LOW (ref 26.6–33.0)
MCHC: 31.7 g/dL (ref 31.5–35.7)
MCV: 83 fL (ref 79–97)
Monocytes Absolute: 0.3 10*3/uL (ref 0.1–0.9)
Monocytes: 6 %
Neutrophils Absolute: 2.4 10*3/uL (ref 1.4–7.0)
Neutrophils: 51 %
Platelets: 343 10*3/uL (ref 150–450)
RBC: 4.22 x10E6/uL (ref 3.77–5.28)
RDW: 12.9 % (ref 11.7–15.4)
WBC: 4.8 10*3/uL (ref 3.4–10.8)

## 2019-11-01 LAB — LIPID PANEL
Chol/HDL Ratio: 2.2 ratio (ref 0.0–4.4)
Cholesterol, Total: 231 mg/dL — ABNORMAL HIGH (ref 100–199)
HDL: 103 mg/dL (ref >39)
LDL Chol Calc (NIH): 121 mg/dL — ABNORMAL HIGH (ref 0–99)
Triglycerides: 44 mg/dL (ref 0–149)
VLDL Cholesterol Cal: 7 mg/dL (ref 5–40)

## 2019-11-01 LAB — IRON,TIBC AND FERRITIN PANEL
Ferritin: 90 ng/mL (ref 15–150)
Iron Saturation: 18 % (ref 15–55)
Iron: 66 ug/dL (ref 27–159)
Total Iron Binding Capacity: 365 ug/dL (ref 250–450)
UIBC: 299 ug/dL (ref 131–425)

## 2020-01-30 ENCOUNTER — Encounter: Payer: Self-pay | Admitting: Nurse Practitioner

## 2020-01-30 ENCOUNTER — Ambulatory Visit (INDEPENDENT_AMBULATORY_CARE_PROVIDER_SITE_OTHER): Payer: Medicaid Other | Admitting: Nurse Practitioner

## 2020-01-30 ENCOUNTER — Encounter: Payer: Medicaid Other | Admitting: Nurse Practitioner

## 2020-01-30 ENCOUNTER — Other Ambulatory Visit: Payer: Self-pay

## 2020-01-30 VITALS — BP 122/74 | HR 64 | Temp 98.3°F | Resp 14 | Ht 67.0 in | Wt 170.0 lb

## 2020-01-30 DIAGNOSIS — Z01419 Encounter for gynecological examination (general) (routine) without abnormal findings: Secondary | ICD-10-CM

## 2020-01-30 DIAGNOSIS — Z1231 Encounter for screening mammogram for malignant neoplasm of breast: Secondary | ICD-10-CM

## 2020-01-30 NOTE — Patient Instructions (Addendum)
Health Maintenance, Female Adopting a healthy lifestyle and getting preventive care are important in promoting health and wellness. Ask your health care provider about:  The right schedule for you to have regular tests and exams.  Things you can do on your own to prevent diseases and keep yourself healthy. What should I know about diet, weight, and exercise? Eat a healthy diet   Eat a diet that includes plenty of vegetables, fruits, low-fat dairy products, and lean protein.  Do not eat a lot of foods that are high in solid fats, added sugars, or sodium. Maintain a healthy weight Body mass index (BMI) is used to identify weight problems. It estimates body fat based on height and weight. Your health care provider can help determine your BMI and help you achieve or maintain a healthy weight. Get regular exercise Get regular exercise. This is one of the most important things you can do for your health. Most adults should:  Exercise for at least 150 minutes each week. The exercise should increase your heart rate and make you sweat (moderate-intensity exercise).  Do strengthening exercises at least twice a week. This is in addition to the moderate-intensity exercise.  Spend less time sitting. Even light physical activity can be beneficial. Watch cholesterol and blood lipids Have your blood tested for lipids and cholesterol at 45 years of age, then have this test every 5 years. Have your cholesterol levels checked more often if:  Your lipid or cholesterol levels are high.  You are older than 45 years of age.  You are at high risk for heart disease. What should I know about cancer screening? Depending on your health history and family history, you may need to have cancer screening at various ages. This may include screening for:  Breast cancer.  Cervical cancer.  Colorectal cancer.  Skin cancer.  Lung cancer. What should I know about heart disease, diabetes, and high blood  pressure? Blood pressure and heart disease  High blood pressure causes heart disease and increases the risk of stroke. This is more likely to develop in people who have high blood pressure readings, are of African descent, or are overweight.  Have your blood pressure checked: ? Every 3-5 years if you are 18-39 years of age. ? Every year if you are 40 years old or older. Diabetes Have regular diabetes screenings. This checks your fasting blood sugar level. Have the screening done:  Once every three years after age 40 if you are at a normal weight and have a low risk for diabetes.  More often and at a younger age if you are overweight or have a high risk for diabetes. What should I know about preventing infection? Hepatitis B If you have a higher risk for hepatitis B, you should be screened for this virus. Talk with your health care provider to find out if you are at risk for hepatitis B infection. Hepatitis C Testing is recommended for:  Everyone born from 1945 through 1965.  Anyone with known risk factors for hepatitis C. Sexually transmitted infections (STIs)  Get screened for STIs, including gonorrhea and chlamydia, if: ? You are sexually active and are younger than 45 years of age. ? You are older than 45 years of age and your health care provider tells you that you are at risk for this type of infection. ? Your sexual activity has changed since you were last screened, and you are at increased risk for chlamydia or gonorrhea. Ask your health care provider if   you are at risk.  Ask your health care provider about whether you are at high risk for HIV. Your health care provider may recommend a prescription medicine to help prevent HIV infection. If you choose to take medicine to prevent HIV, you should first get tested for HIV. You should then be tested every 3 months for as long as you are taking the medicine. Pregnancy  If you are about to stop having your period (premenopausal) and  you may become pregnant, seek counseling before you get pregnant.  Take 400 to 800 micrograms (mcg) of folic acid every day if you become pregnant.  Ask for birth control (contraception) if you want to prevent pregnancy. Osteoporosis and menopause Osteoporosis is a disease in which the bones lose minerals and strength with aging. This can result in bone fractures. If you are 65 years old or older, or if you are at risk for osteoporosis and fractures, ask your health care provider if you should:  Be screened for bone loss.  Take a calcium or vitamin D supplement to lower your risk of fractures.  Be given hormone replacement therapy (HRT) to treat symptoms of menopause. Follow these instructions at home: Lifestyle  Do not use any products that contain nicotine or tobacco, such as cigarettes, e-cigarettes, and chewing tobacco. If you need help quitting, ask your health care provider.  Do not use street drugs.  Do not share needles.  Ask your health care provider for help if you need support or information about quitting drugs. Alcohol use  Do not drink alcohol if: ? Your health care provider tells you not to drink. ? You are pregnant, may be pregnant, or are planning to become pregnant.  If you drink alcohol: ? Limit how much you use to 0-1 drink a day. ? Limit intake if you are breastfeeding.  Be aware of how much alcohol is in your drink. In the [place] one drink equals one 12 oz bottle of beer (355 mL), one 5 oz glass of wine (148 mL), or one 1 oz glass of hard liquor (44 mL). General instructions  Schedule regular health, dental, and eye exams.  Stay current with your vaccines.  Tell your health care provider if: ? You often feel depressed. ? You have ever been abused or do not feel safe at home. Summary  Adopting a healthy lifestyle and getting preventive care are important in promoting health and wellness.  Follow your health care provider's instructions about healthy  diet, exercising, and getting tested or screened for diseases.  Follow your health care provider's instructions on monitoring your cholesterol and blood pressure. This information is not intended to replace advice given to you by your health care provider. Make sure you discuss any questions you have with your health care provider. Document Revised: 06/09/2018 Document Reviewed: 06/09/2018 Elsevier Patient Education  2020 [employer]  

## 2020-01-30 NOTE — Progress Notes (Signed)
   [address] [place], [place]  [address] Phone:  [phone]   Fax:  [phone] Subjective:     Melanie Ortiz is a 44 y.o. female and is here for a comprehensive physical exam. The patient reports no problems.  Social History   Socioeconomic History  . Marital status: Single    Spouse name: Not on file  . Number of children: 3  . Years of education: Not on file  . Highest education level: 8th grade  Occupational History  . Not on file  Tobacco Use  . Smoking status: Never Smoker  . Smokeless tobacco: Former Systems developer    Types: Snuff  Vaping Use  . Vaping Use: Never used  Substance and Sexual Activity  . Alcohol use: Yes    Alcohol/week: 3.0 standard drinks    Types: 3 Cans of beer per week    Comment: everyday   . Drug use: Never  . Sexual activity: Yes    Birth control/protection: Post-menopausal  Other Topics Concern  . Not on file  Social History Narrative  . Not on file   Social Determinants of Health   Financial Resource Strain:   . Difficulty of Paying Living Expenses:   Food Insecurity:   . Worried About Charity fundraiser in the Last Year:   . Arboriculturist in the Last Year:   Transportation Needs:   . Film/video editor (Medical):   Marland Kitchen Lack of Transportation (Non-Medical):   Physical Activity:   . Days of Exercise per Week:   . Minutes of Exercise per Session:   Stress:   . Feeling of Stress :   Social Connections:   . Frequency of Communication with Friends and Family:   . Frequency of Social Gatherings with Friends and Family:   . Attends Religious Services:   . Active Member of Clubs or Organizations:   . Attends Archivist Meetings:   Marland Kitchen Marital Status:   Intimate Partner Violence:   . Fear of Current or Ex-Partner:   . Emotionally Abused:   Marland Kitchen Physically Abused:   . Sexually Abused:    Health Maintenance  Topic Date Due  . Hepatitis C Screening  Never done  . COVID-19 Vaccine (1) Never done   . INFLUENZA VACCINE  01/29/2020  . TETANUS/TDAP  07/02/2028  . HIV Screening  Completed  . PAP SMEAR-Modifier  Discontinued    The following portions of the patient's history were reviewed and updated as appropriate: allergies, current medications, past family history, past medical history, past social history, past surgical history and problem list.  Review of Systems Pertinent items are noted in HPI.   Objective:    BP 122/74 (BP Location: Left Arm, Patient Position: Sitting, Cuff Size: Normal)   Pulse 64   Temp 98.3 F (36.8 C) (Oral)   Resp 14   Ht 5\' 7"  (1.702 m)   Wt 170 lb (77.1 kg)   SpO2 100%   BMI 26.63 kg/m  General appearance: alert, cooperative, appears stated age and no distress Breasts: normal appearance, no masses or tenderness Pelvic: cervix normal in appearance, external genitalia normal, no adnexal masses or tenderness, no cervical motion tenderness, rectovaginal septum normal, uterus normal size, shape, and consistency and vagina normal without discharge    Assessment:    Healthy female exam. screening     Plan:     See After Visit Summary for Counseling Recommendations

## 2020-01-31 LAB — IGP, RFX APTIMA HPV ASCU

## 2020-04-02 ENCOUNTER — Other Ambulatory Visit: Payer: Self-pay

## 2020-04-02 ENCOUNTER — Ambulatory Visit
Admission: RE | Admit: 2020-04-02 | Discharge: 2020-04-02 | Disposition: A | Discharge status: Home / Self Care | Payer: Medicaid Other | Source: Ambulatory Visit | Attending: Nurse Practitioner | Admitting: Nurse Practitioner

## 2020-04-02 DIAGNOSIS — Z1231 Encounter for screening mammogram for malignant neoplasm of breast: Secondary | ICD-10-CM

## 2020-04-30 ENCOUNTER — Ambulatory Visit
Admission: RE | Admit: 2020-04-30 | Discharge: 2020-04-30 | Disposition: A | Discharge status: Home / Self Care | Payer: Medicaid Other | Source: Ambulatory Visit | Attending: Nurse Practitioner | Admitting: Nurse Practitioner

## 2020-04-30 ENCOUNTER — Other Ambulatory Visit: Payer: Self-pay

## 2020-04-30 DIAGNOSIS — Z1231 Encounter for screening mammogram for malignant neoplasm of breast: Secondary | ICD-10-CM | POA: Diagnosis not present

## 2020-05-02 ENCOUNTER — Ambulatory Visit: Payer: Medicaid Other | Admitting: Nurse Practitioner

## 2020-05-03 ENCOUNTER — Ambulatory Visit (INDEPENDENT_AMBULATORY_CARE_PROVIDER_SITE_OTHER): Payer: Medicaid Other | Admitting: Nurse Practitioner

## 2020-05-03 ENCOUNTER — Other Ambulatory Visit: Payer: Self-pay | Admitting: Nurse Practitioner

## 2020-05-03 ENCOUNTER — Other Ambulatory Visit: Payer: Self-pay

## 2020-05-03 ENCOUNTER — Encounter: Payer: Self-pay | Admitting: Nurse Practitioner

## 2020-05-03 VITALS — BP 110/71 | HR 79 | Temp 97.9°F | Resp 17 | Ht 67.0 in | Wt 168.2 lb

## 2020-05-03 DIAGNOSIS — Z1159 Encounter for screening for other viral diseases: Secondary | ICD-10-CM

## 2020-05-03 DIAGNOSIS — M25361 Other instability, right knee: Secondary | ICD-10-CM | POA: Diagnosis not present

## 2020-05-03 DIAGNOSIS — Z23 Encounter for immunization: Secondary | ICD-10-CM

## 2020-05-03 DIAGNOSIS — R921 Mammographic calcification found on diagnostic imaging of breast: Secondary | ICD-10-CM

## 2020-05-03 DIAGNOSIS — Z0000 Encounter for general adult medical examination without abnormal findings: Secondary | ICD-10-CM

## 2020-05-03 MED ORDER — IBUPROFEN 800 MG PO TABS: 800.0000 mg | ORAL_TABLET | Freq: Three times a day (TID) | ORAL | 3 refills | Status: DC | PRN

## 2020-05-03 MED ORDER — MONTELUKAST SODIUM 10 MG PO TABS: 10.0000 mg | ORAL_TABLET | Freq: Every day | ORAL | 3 refills | Status: DC

## 2020-05-03 NOTE — Patient Instructions (Signed)
Health Maintenance, Female Adopting a healthy lifestyle and getting preventive care are important in promoting health and wellness. Ask your health care provider about:  The right schedule for you to have regular tests and exams.  Things you can do on your own to prevent diseases and keep yourself healthy. What should I know about diet, weight, and exercise? Eat a healthy diet   Eat a diet that includes plenty of vegetables, fruits, low-fat dairy products, and lean protein.  Do not eat a lot of foods that are high in solid fats, added sugars, or sodium. Maintain a healthy weight Body mass index (BMI) is used to identify weight problems. It estimates body fat based on height and weight. Your health care provider can help determine your BMI and help you achieve or maintain a healthy weight. Get regular exercise Get regular exercise. This is one of the most important things you can do for your health. Most adults should:  Exercise for at least 150 minutes each week. The exercise should increase your heart rate and make you sweat (moderate-intensity exercise).  Do strengthening exercises at least twice a week. This is in addition to the moderate-intensity exercise.  Spend less time sitting. Even light physical activity can be beneficial. Watch cholesterol and blood lipids Have your blood tested for lipids and cholesterol at 45 years of age, then have this test every 5 years. Have your cholesterol levels checked more often if:  Your lipid or cholesterol levels are high.  You are older than 45 years of age.  You are at high risk for heart disease. What should I know about cancer screening? Depending on your health history and family history, you may need to have cancer screening at various ages. This may include screening for:  Breast cancer.  Cervical cancer.  Colorectal cancer.  Skin cancer.  Lung cancer. What should I know about heart disease, diabetes, and high blood  pressure? Blood pressure and heart disease  High blood pressure causes heart disease and increases the risk of stroke. This is more likely to develop in people who have high blood pressure readings, are of African descent, or are overweight.  Have your blood pressure checked: ? Every 3-5 years if you are 18-39 years of age. ? Every year if you are 40 years old or older. Diabetes Have regular diabetes screenings. This checks your fasting blood sugar level. Have the screening done:  Once every three years after age 40 if you are at a normal weight and have a low risk for diabetes.  More often and at a younger age if you are overweight or have a high risk for diabetes. What should I know about preventing infection? Hepatitis B If you have a higher risk for hepatitis B, you should be screened for this virus. Talk with your health care provider to find out if you are at risk for hepatitis B infection. Hepatitis C Testing is recommended for:  Everyone born from 1945 through 1965.  Anyone with known risk factors for hepatitis C. Sexually transmitted infections (STIs)  Get screened for STIs, including gonorrhea and chlamydia, if: ? You are sexually active and are younger than 45 years of age. ? You are older than 45 years of age and your health care provider tells you that you are at risk for this type of infection. ? Your sexual activity has changed since you were last screened, and you are at increased risk for chlamydia or gonorrhea. Ask your health care provider if   you are at risk.  Ask your health care provider about whether you are at high risk for HIV. Your health care provider may recommend a prescription medicine to help prevent HIV infection. If you choose to take medicine to prevent HIV, you should first get tested for HIV. You should then be tested every 3 months for as long as you are taking the medicine. Pregnancy  If you are about to stop having your period (premenopausal) and  you may become pregnant, seek counseling before you get pregnant.  Take 400 to 800 micrograms (mcg) of folic acid every day if you become pregnant.  Ask for birth control (contraception) if you want to prevent pregnancy. Osteoporosis and menopause Osteoporosis is a disease in which the bones lose minerals and strength with aging. This can result in bone fractures. If you are 5 years old or older, or if you are at risk for osteoporosis and fractures, ask your health care provider if you should:  Be screened for bone loss.  Take a calcium or vitamin D supplement to lower your risk of fractures.  Be given hormone replacement therapy (HRT) to treat symptoms of menopause. Follow these instructions at home: Lifestyle  Do not use any products that contain nicotine or tobacco, such as cigarettes, e-cigarettes, and chewing tobacco. If you need help quitting, ask your health care provider.  Do not use street drugs.  Do not share needles.  Ask your health care provider for help if you need support or information about quitting drugs. Alcohol use  Do not drink alcohol if: ? Your health care provider tells you not to drink. ? You are pregnant, may be pregnant, or are planning to become pregnant.  If you drink alcohol: ? Limit how much you use to 0-1 drink a day. ? Limit intake if you are breastfeeding.  Be aware of how much alcohol is in your drink. In the [place] one drink equals one 12 oz bottle of beer (355 mL), one 5 oz glass of wine (148 mL), or one 1 oz glass of hard liquor (44 mL). General instructions  Schedule regular health, dental, and eye exams.  Stay current with your vaccines.  Tell your health care provider if: ? You often feel depressed. ? You have ever been abused or do not feel safe at home. Summary  Adopting a healthy lifestyle and getting preventive care are important in promoting health and wellness.  Follow your health care provider's instructions about healthy  diet, exercising, and getting tested or screened for diseases.  Follow your health care provider's instructions on monitoring your cholesterol and blood pressure. This information is not intended to replace advice given to you by your health care provider. Make sure you discuss any questions you have with your health care provider. Document Revised: 06/09/2018 Document Reviewed: 06/09/2018 Elsevier Patient Education  2020 [employer], 15(4), [phone]. Retrieved April 05, 2018 from [email or web address].com/[email or web address]">  Knee Exercises Ask your health care provider which exercises are safe for you. Do exercises exactly as told by your health care provider and adjust them as directed. It is normal to feel mild stretching, pulling, tightness, or discomfort as you do these exercises. Stop right away if you feel sudden pain or your pain gets worse. Do not begin these exercises until told by your health care provider. Stretching and range-of-motion exercises These exercises warm up your muscles and joints and improve the movement and flexibility of your knee. These exercises also help  to relieve pain and swelling. Knee extension, prone 1. Lie on your abdomen (prone position) on a bed. 2. Place your left / right knee just beyond the edge of the surface so your knee is not on the bed. You can put a towel under your left / right thigh just above your kneecap for comfort. 3. Relax your leg muscles and allow gravity to straighten your knee (extension). You should feel a stretch behind your left / right knee. 4. Hold this position for __________ seconds. 5. Scoot up so your knee is supported between repetitions. Repeat __________ times. Complete this exercise __________ times a day. Knee flexion, active  1. Lie on your back with both legs straight. If this causes back discomfort, bend your left / right knee so your foot is flat on the floor. 2. Slowly slide your left / right  heel back toward your buttocks. Stop when you feel a gentle stretch in the front of your knee or thigh (flexion). 3. Hold this position for __________ seconds. 4. Slowly slide your left / right heel back to the starting position. Repeat __________ times. Complete this exercise __________ times a day. Quadriceps stretch, prone  1. Lie on your abdomen on a firm surface, such as a bed or padded floor. 2. Bend your left / right knee and hold your ankle. If you cannot reach your ankle or pant leg, loop a belt around your foot and grab the belt instead. 3. Gently pull your heel toward your buttocks. Your knee should not slide out to the side. You should feel a stretch in the front of your thigh and knee (quadriceps). 4. Hold this position for __________ seconds. Repeat __________ times. Complete this exercise __________ times a day. Hamstring, supine 1. Lie on your back (supine position). 2. Loop a belt or towel over the ball of your left / right foot. The ball of your foot is on the walking surface, right under your toes. 3. Straighten your left / right knee and slowly pull on the belt to raise your leg until you feel a gentle stretch behind your knee (hamstring). ? Do not let your knee bend while you do this. ? Keep your other leg flat on the floor. 4. Hold this position for __________ seconds. Repeat __________ times. Complete this exercise __________ times a day. Strengthening exercises These exercises build strength and endurance in your knee. Endurance is the ability to use your muscles for a long time, even after they get tired. Quadriceps, isometric This exercise stretches the muscles in front of your thigh (quadriceps) without moving your knee joint (isometric). 1. Lie on your back with your left / right leg extended and your other knee bent. Put a rolled towel or small pillow under your knee if told by your health care provider. 2. Slowly tense the muscles in the front of your left /  right thigh. You should see your kneecap slide up toward your hip or see increased dimpling just above the knee. This motion will push the back of the knee toward the floor. 3. For __________ seconds, hold the muscle as tight as you can without increasing your pain. 4. Relax the muscles slowly and completely. Repeat __________ times. Complete this exercise __________ times a day. Straight leg raises This exercise stretches the muscles in front of your thigh (quadriceps) and the muscles that move your hips (hip flexors). 1. Lie on your back with your left / right leg extended and your other knee bent. 2.  Tense the muscles in the front of your left / right thigh. You should see your kneecap slide up or see increased dimpling just above the knee. Your thigh may even shake a bit. 3. Keep these muscles tight as you raise your leg 4-6 inches (10-15 cm) off the floor. Do not let your knee bend. 4. Hold this position for __________ seconds. 5. Keep these muscles tense as you lower your leg. 6. Relax your muscles slowly and completely after each repetition. Repeat __________ times. Complete this exercise __________ times a day. Hamstring, isometric 1. Lie on your back on a firm surface. 2. Bend your left / right knee about __________ degrees. 3. Dig your left / right heel into the surface as if you are trying to pull it toward your buttocks. Tighten the muscles in the back of your thighs (hamstring) to "dig" as hard as you can without increasing any pain. 4. Hold this position for __________ seconds. 5. Release the tension gradually and allow your muscles to relax completely for __________ seconds after each repetition. Repeat __________ times. Complete this exercise __________ times a day. Hamstring curls If told by your health care provider, do this exercise while wearing ankle weights. Begin with __________ lb weights. Then increase the weight by 1 lb (0.5 kg) increments. Do not wear ankle weights  that are more than __________ lb. 1. Lie on your abdomen with your legs straight. 2. Bend your left / right knee as far as you can without feeling pain. Keep your hips flat against the floor. 3. Hold this position for __________ seconds. 4. Slowly lower your leg to the starting position. Repeat __________ times. Complete this exercise __________ times a day. Squats This exercise strengthens the muscles in front of your thigh and knee (quadriceps). 1. Stand in front of a table, with your feet and knees pointing straight ahead. You may rest your hands on the table for balance but not for support. 2. Slowly bend your knees and lower your hips like you are going to sit in a chair. ? Keep your weight over your heels, not over your toes. ? Keep your lower legs upright so they are parallel with the table legs. ? Do not let your hips go lower than your knees. ? Do not bend lower than told by your health care provider. ? If your knee pain increases, do not bend as low. 3. Hold the squat position for __________ seconds. 4. Slowly push with your legs to return to standing. Do not use your hands to pull yourself to standing. Repeat __________ times. Complete this exercise __________ times a day. Wall slides This exercise strengthens the muscles in front of your thigh and knee (quadriceps). 1. Lean your back against a smooth wall or door, and walk your feet out 18-24 inches (46-61 cm) from it. 2. Place your feet hip-width apart. 3. Slowly slide down the wall or door until your knees bend __________ degrees. Keep your knees over your heels, not over your toes. Keep your knees in line with your hips. 4. Hold this position for __________ seconds. Repeat __________ times. Complete this exercise __________ times a day. Straight leg raises This exercise strengthens the muscles that rotate the leg at the hip and move it away from your body (hip abductors). 1. Lie on your side with your left / right leg in the  top position. Lie so your head, shoulder, knee, and hip line up. You may bend your bottom knee to help you  keep your balance. 2. Roll your hips slightly forward so your hips are stacked directly over each other and your left / right knee is facing forward. 3. Leading with your heel, lift your top leg 4-6 inches (10-15 cm). You should feel the muscles in your outer hip lifting. ? Do not let your foot drift forward. ? Do not let your knee roll toward the ceiling. 4. Hold this position for __________ seconds. 5. Slowly return your leg to the starting position. 6. Let your muscles relax completely after each repetition. Repeat __________ times. Complete this exercise __________ times a day. Straight leg raises This exercise stretches the muscles that move your hips away from the front of the pelvis (hip extensors). 1. Lie on your abdomen on a firm surface. You can put a pillow under your hips if that is more comfortable. 2. Tense the muscles in your buttocks and lift your left / right leg about 4-6 inches (10-15 cm). Keep your knee straight as you lift your leg. 3. Hold this position for __________ seconds. 4. Slowly lower your leg to the starting position. 5. Let your leg relax completely after each repetition. Repeat __________ times. Complete this exercise __________ times a day. This information is not intended to replace advice given to you by your health care provider. Make sure you discuss any questions you have with your health care provider. Document Revised: 04/06/2018 Document Reviewed: 04/06/2018 Elsevier Patient Education  [place].  Breast Ultrasound Breast ultrasound, or breast ultrasonogram, is a procedure that is used to check for breast problems. A breast ultrasound uses sound waves and a computer to create pictures of the inside of a breast. This procedure can help determine whether a lump in your breast is a fluid-filled sac (cyst), a solid tumor, or a growth.  Sometimes a breast ultrasound is done to locate nodules in the breast that are too small to feel but need to be removed during surgery. Tell a health care provider about:  Any allergies you have.  All medicines you are taking, including vitamins, herbs, eye drops, creams, and over-the-counter medicines.  Any blood disorders you have.  Any surgeries you have had.  Any medical conditions you have or have had. What are the risks? Breast ultrasound is safe and painless. Ultrasound imaging does not use X-rays. There are no known risks for this procedure. What happens before the procedure? You do not need to prepare for a breast ultrasound. You will undress from your waist up, so wear loose, comfortable clothing on the day of the procedure. What happens during the procedure?   You will lie down on an exam table.  A health care provider will apply gel to your breast area.  You may be asked to hold your arm above your head.  The health care provider will move a handheld probe, which looks like a microphone, back and forth over your breast.  The probe will send signals to a computer that will create images of your breast.  When the procedure is over, the gel will be cleaned from your breast, and you can get dressed. What can I expect after the procedure?  You can go home right away and do all of your usual activities.  It is up to you to get the results of your procedure. Ask your health care provider, or the department that is doing the procedure, when your results will be ready. Summary  A breast ultrasound is a procedure that is used to  check for breast problems.  This procedure uses sound waves and a computer to create pictures of the inside of your breast.  You can go home right away and do all of your usual activities. Make sure you know how to get the results of your procedure. This information is not intended to replace advice given to you by your health care provider. Make  sure you discuss any questions you have with your health care provider. Document Revised: 02/07/2019 Document Reviewed: 02/07/2019 Elsevier Patient Education  [employer].

## 2020-05-03 NOTE — Progress Notes (Addendum)
[place], [place]  [address] Phone:  [phone]   Fax:  [phone]   Established Patient Office Visit  Subjective:  Patient ID: Melanie Ortiz, female    DOB: 1975/10/01  Age: 44 y.o. MRN: [id number]  CC:  Chief Complaint  Patient presents with  . Follow-up    HPI Melanie Ortiz presents for follow up. She  has a past medical history of Allergic reaction, Anemia, Facial swelling, and Neuromuscular disorder ([place]) (2015).   She was recently in for her baseline mammogram.  She was noted to have bilateral calcifications that are requiring additional evaluation.  She is concerned on what this may indicate.  She denies any problems or concerns.  Anemia Patient presents for evaluation of anemia. Anemia was found by routine CBC. It has been present for several months. Associated signs & symptoms: none.  Condition stable overall.  Allergic Rhinitis Patient presents for evaluation of allergic symptoms.  Symptoms include nasal congestion and are present in a seasonal pattern.  Precipitants include varies.  Treatment in the past has included oral antihistamines: Levocetirizine,  leukotriene receptor antagonist.  Treatment currently includes  leukotriene receptor antagonist and is effective in the patient's opinion.  Knee Pain Patient presents changes involving the right knee. Onset of the symptoms was several weeks ago. Inciting event: none known. Current symptoms include giving out, locking and stiffness. Pain is aggravated by inactivity, rising after sitting and standing. Patient has had no prior knee problems. Evaluation to date: none. Treatment to date: avoidance of offending activity and OTC analgesics which are not very effective.  Past Medical History:  Diagnosis Date  . Allergic reaction   . Anemia   . Facial swelling   . Neuromuscular disorder ([place]) 2015   bilateral legs and hands- spasms.     Past Surgical History:  Procedure  Laterality Date  . ABDOMINAL HYSTERECTOMY     04/25/2015    Family History  Problem Relation Age of Onset  . Cancer Mother        cervical   . GER disease Mother   . Diabetes Brother   . Ovarian cysts Daughter   . ADD / ADHD Son   . Breast cancer Cousin     Social History   Socioeconomic History  . Marital status: Single    Spouse name: Not on file  . Number of children: 3  . Years of education: Not on file  . Highest education level: 8th grade  Occupational History  . Not on file  Tobacco Use  . Smoking status: Never Smoker  . Smokeless tobacco: Former Systems developer    Types: Snuff  Vaping Use  . Vaping Use: Never used  Substance and Sexual Activity  . Alcohol use: Yes    Alcohol/week: 3.0 standard drinks    Types: 3 Cans of beer per week    Comment: everyday   . Drug use: Never  . Sexual activity: Yes    Birth control/protection: Post-menopausal  Other Topics Concern  . Not on file  Social History Narrative  . Not on file   Social Determinants of Health   Financial Resource Strain:   . Difficulty of Paying Living Expenses: Not on file  Food Insecurity:   . Worried About Charity fundraiser in the Last Year: Not on file  . Ran Out of Food in the Last Year: Not on file  Transportation Needs:   . Lack of Transportation (Medical): Not on  file  . Lack of Transportation (Non-Medical): Not on file  Physical Activity:   . Days of Exercise per Week: Not on file  . Minutes of Exercise per Session: Not on file  Stress:   . Feeling of Stress : Not on file  Social Connections:   . Frequency of Communication with Friends and Family: Not on file  . Frequency of Social Gatherings with Friends and Family: Not on file  . Attends Religious Services: Not on file  . Active Member of Clubs or Organizations: Not on file  . Attends Archivist Meetings: Not on file  . Marital Status: Not on file  Intimate Partner Violence:   . Fear of Current or Ex-Partner: Not on file    . Emotionally Abused: Not on file  . Physically Abused: Not on file  . Sexually Abused: Not on file    Outpatient Medications Prior to Visit  Medication Sig Dispense Refill  . cholecalciferol (VITAMIN D3) 25 MCG (1000 UT) tablet Take 1,000 Units by mouth daily.    . cyclobenzaprine (FLEXERIL) 10 MG tablet Take 1 tablet (10 mg total) by mouth 2 (two) times daily as needed for muscle spasms. 20 tablet 0  . ferrous sulfate 325 (65 FE) MG tablet Take 325 mg by mouth daily with breakfast.    . ibuprofen (ADVIL) 800 MG tablet Take 1 tablet (800 mg total) by mouth every 8 (eight) hours as needed for moderate pain. Most last 7 days. 30 tablet 3  . montelukast (SINGULAIR) 10 MG tablet Take 1 tablet (10 mg total) by mouth at bedtime. 30 tablet 3   No facility-administered medications prior to visit.    Allergies  Allergen Reactions  . Onion     ROS Review of Systems    Objective:    Physical Exam Constitutional:      General: She is not in acute distress.    Appearance: She is normal weight.  HENT:     Head: Normocephalic and atraumatic.     Nose: Nose normal.  Cardiovascular:     Rate and Rhythm: Normal rate and regular rhythm.     Pulses: Normal pulses.     Heart sounds: Normal heart sounds.  Pulmonary:     Effort: Pulmonary effort is normal.     Breath sounds: Normal breath sounds.  Abdominal:     Palpations: Abdomen is soft.  Musculoskeletal:     Cervical back: Normal range of motion.     Right knee: Swelling present.  Skin:    General: Skin is warm and dry.  Neurological:     Mental Status: She is alert.     BP 110/71 (BP Location: Left Arm, Patient Position: Sitting, Cuff Size: Normal)   Pulse 79   Temp 97.9 F (36.6 C)   Resp 17   Ht 5\' 7"  (1.702 m)   Wt 168 lb 3.2 oz (76.3 kg)   SpO2 98%   BMI 26.34 kg/m  Wt Readings from Last 3 Encounters:  05/03/20 168 lb 3.2 oz (76.3 kg)  01/30/20 170 lb (77.1 kg)  10/31/19 167 lb (75.8 kg)     There are no  preventive care reminders to display for this patient.  There are no preventive care reminders to display for this patient.  No results found for: TSH Lab Results  Component Value Date   WBC 4.8 10/31/2019   HGB 11.1 10/31/2019   HCT 35.0 10/31/2019   MCV 83 10/31/2019   PLT 343 10/31/2019  Lab Results  Component Value Date   NA 141 10/31/2019   K 3.6 10/31/2019   CO2 20 07/25/2019   GLUCOSE 80 10/31/2019   BUN 9 10/31/2019   CREATININE 0.78 10/31/2019   BILITOT <0.2 10/31/2019   ALKPHOS 57 10/31/2019   AST 19 10/31/2019   ALT 9 07/25/2019   PROT 6.9 10/31/2019   ALBUMIN 4.4 10/31/2019   CALCIUM 9.1 10/31/2019   Lab Results  Component Value Date   CHOL 231 (H) 10/31/2019   Lab Results  Component Value Date   HDL 103 10/31/2019   Lab Results  Component Value Date   LDLCALC 121 (H) 10/31/2019   Lab Results  Component Value Date   TRIG 44 10/31/2019   Lab Results  Component Value Date   CHOLHDL 2.2 10/31/2019   No results found for: HGBA1C    Assessment & Plan:   Problem List Items Addressed This Visit    None    Visit Diagnoses    Healthcare maintenance    -  Primary   Relevant Orders   Flu Vaccine QUAD 6+ mos PF IM (Fluarix Quad PF) (Completed)   Encounter for hepatitis C screening test for low risk patient       Relevant Orders   Hepatitis C antibody (Completed)   Breast calcifications on mammogram    Patient appointment made for breast center for diagnostic mammogram and ultrasound if needed June 01, 2020 at 2:40 PM   Right knee buckling    We will continue to monitor buckling if it persists or gets worse further evaluate with x-ray will be completed patient discharged with some strengthening exercises for her knees and back      Meds ordered this encounter  Medications  . montelukast (SINGULAIR) 10 MG tablet    Sig: Take 1 tablet (10 mg total) by mouth at bedtime.    Dispense:  90 tablet    Refill:  3    Order Specific Question:    Supervising Provider    Answer:   Tresa Garter [id number]  . ibuprofen (ADVIL) 800 MG tablet    Sig: Take 1 tablet (800 mg total) by mouth every 8 (eight) hours as needed for moderate pain. Most last 7 days.    Dispense:  90 tablet    Refill:  3    For acute post-operative pain. Not to be refilled.  Most last 7 days.    Order Specific Question:   Supervising Provider    Answer:   Tresa Garter [[id number]]    Follow-up: Return in about 6 months (around 10/31/2020).    Vevelyn Francois, NP

## 2020-05-04 LAB — HEPATITIS C ANTIBODY: Hep C Virus Ab: <0.1 s/co ratio (ref 0.0–0.9)

## 2020-06-01 ENCOUNTER — Ambulatory Visit
Admission: RE | Admit: 2020-06-01 | Discharge: 2020-06-01 | Disposition: A | Discharge status: Home / Self Care | Payer: Medicaid Other | Source: Ambulatory Visit | Attending: Nurse Practitioner | Admitting: Nurse Practitioner

## 2020-06-01 ENCOUNTER — Other Ambulatory Visit: Payer: Medicaid Other

## 2020-06-01 ENCOUNTER — Other Ambulatory Visit: Payer: Self-pay

## 2020-06-01 DIAGNOSIS — R921 Mammographic calcification found on diagnostic imaging of breast: Secondary | ICD-10-CM

## 2020-07-13 ENCOUNTER — Other Ambulatory Visit: Payer: Self-pay | Admitting: Nurse Practitioner

## 2020-07-26 ENCOUNTER — Encounter: Payer: Self-pay | Admitting: Nurse Practitioner

## 2020-07-26 ENCOUNTER — Telehealth (INDEPENDENT_AMBULATORY_CARE_PROVIDER_SITE_OTHER): Payer: Medicaid Other | Admitting: Nurse Practitioner

## 2020-07-26 VITALS — Temp 96.8°F | Ht 67.0 in | Wt 168.0 lb

## 2020-07-26 DIAGNOSIS — U071 COVID-19: Secondary | ICD-10-CM | POA: Diagnosis not present

## 2020-07-26 DIAGNOSIS — R059 Cough, unspecified: Secondary | ICD-10-CM

## 2020-07-26 NOTE — Progress Notes (Signed)
   [name], [place]  [address] Phone:  [phone]   Fax:  [phone] Virtual Visit via Telephone Note  I connected with Melanie Ortiz on 07/26/20 at  2:40 PM EST by telephone and verified that I am speaking with the correct person using two identifiers.   I discussed the limitations, risks, security and privacy concerns of performing an evaluation and management service by telephone and the availability of in person appointments. I also discussed with the patient that there may be a patient responsible charge related to this service. The patient expressed understanding and agreed to proceed.  Patient home Provider Office  History of Present Illness:  Cough Patient complains of nasal congestion, productive cough and sore throat. Symptoms began several days ago. Symptoms have been gradually worsening since that time.The cough is productive of clear and and thick sputum and is aggravated by  COVID-19. Associated symptoms include: change in voice and sputum production. Patient has not had a previous chest x-ray.  She has tried cough drops, lemon, peppermint and tea. She has also tried Mucinex. She tested positive last Tuesday. She has been out of work for one week and is not ready to return.   Observations/Objective: Notable change in voice with speech.   Assessment and Plan: Assessment  Primary Diagnosis & Pertinent Problem List: The primary encounter diagnosis was COVID-19 virus detected. A diagnosis of Cough was also pertinent to this visit.  Visit Diagnosis: 1. COVID-19 virus detected   2. Cough      Follow Up Instructions:  Fu as scheduled  [email or web address]   I discussed the assessment and treatment plan with the patient. The patient was provided an opportunity to ask questions and all were answered. The patient agreed with the plan and demonstrated an understanding of the instructions.   The patient was advised to call back or  seek an in-person evaluation if the symptoms worsen or if the condition fails to improve as anticipated.  I provided 15 minutes of video- face-to-face time during this encounter.   Vevelyn Francois, NP

## 2020-10-31 ENCOUNTER — Ambulatory Visit (HOSPITAL_COMMUNITY)
Admission: RE | Admit: 2020-10-31 | Discharge: 2020-10-31 | Disposition: A | Discharge status: Home / Self Care | Payer: Medicaid Other | Source: Ambulatory Visit | Attending: Nurse Practitioner | Admitting: Nurse Practitioner

## 2020-10-31 ENCOUNTER — Ambulatory Visit: Payer: Medicaid Other | Admitting: Nurse Practitioner

## 2020-10-31 ENCOUNTER — Other Ambulatory Visit: Payer: Self-pay

## 2020-10-31 ENCOUNTER — Encounter: Payer: Self-pay | Admitting: Nurse Practitioner

## 2020-10-31 VITALS — BP 115/75 | HR 72 | Temp 99.2°F | Ht 67.0 in | Wt 162.0 lb

## 2020-10-31 DIAGNOSIS — G8929 Other chronic pain: Secondary | ICD-10-CM

## 2020-10-31 DIAGNOSIS — Z8781 Personal history of (healed) traumatic fracture: Secondary | ICD-10-CM | POA: Diagnosis not present

## 2020-10-31 DIAGNOSIS — D509 Iron deficiency anemia, unspecified: Secondary | ICD-10-CM

## 2020-10-31 DIAGNOSIS — M25531 Pain in right wrist: Secondary | ICD-10-CM | POA: Diagnosis not present

## 2020-10-31 DIAGNOSIS — M7989 Other specified soft tissue disorders: Secondary | ICD-10-CM | POA: Diagnosis not present

## 2020-10-31 DIAGNOSIS — E559 Vitamin D deficiency, unspecified: Secondary | ICD-10-CM

## 2020-10-31 MED ORDER — SALONPAS PAIN REL GEL-PTCH HOT 0.025-1.25 % EX PTCH: 1.0000 | MEDICATED_PATCH | Freq: Three times a day (TID) | CUTANEOUS | 11 refills | Status: AC

## 2020-10-31 MED ORDER — PREDNISONE 20 MG PO TABS
ORAL_TABLET | ORAL | 0 refills | Status: AC
Start: 2020-10-31 — End: 2020-11-09

## 2020-10-31 NOTE — Progress Notes (Signed)
[place], [place]  [address] Phone:  [phone]   Fax:  [phone]   Established Patient Office Visit  Subjective:  Patient ID: Melanie Ortiz, female    DOB: August 22, 1975  Age: 45 y.o. MRN: [id number]  CC:  Chief Complaint  Patient presents with  . Follow-up    6 month follow up  having back pain lower , off and on for 1 month take pain  for pain , wrist pain  wear brace, hx fx , swelling , pain , using ice and heat on it not help pain     HPI Melanie Ortiz presents for follow up. She  has a past medical history of Allergic reaction, Anemia, COVID, Facial swelling, and Neuromuscular disorder ([place]) (2015).   Hx of right wrist fracture at the age 72 from a skate related fall. This was repaired in the ED with a cast. She reports that as she ages the pain is progressing. She is wearing an ace during the day while working and wearing a brace at night. This is due to increased swelling. Any activity of the wrist makes the pain worse. She has started a new job which requires increased use of her upper extremities.  She is using [employer] 800 mg which was provided for back pain which has helped some. She is also wearing her back brace .  Past Medical History:  Diagnosis Date  . Allergic reaction   . Anemia   . COVID   . Facial swelling   . Neuromuscular disorder ([place]) 2015   bilateral legs and hands- spasms.     Past Surgical History:  Procedure Laterality Date  . ABDOMINAL HYSTERECTOMY     04/25/2015    Family History  Problem Relation Age of Onset  . Cancer Mother        cervical   . GER disease Mother   . Diabetes Brother   . Ovarian cysts Daughter   . ADD / ADHD Son   . Breast cancer Cousin     Social History   Socioeconomic History  . Marital status: Single    Spouse name: Not on file  . Number of children: 3  . Years of education: Not on file  . Highest education level: 8th grade  Occupational History  . Not on file   Tobacco Use  . Smoking status: Never Smoker  . Smokeless tobacco: Former Systems developer    Types: Snuff  Vaping Use  . Vaping Use: Never used  Substance and Sexual Activity  . Alcohol use: Not Currently    Alcohol/week: 3.0 standard drinks    Types: 3 Cans of beer per week    Comment: everyday   . Drug use: Never  . Sexual activity: Yes    Birth control/protection: Post-menopausal  Other Topics Concern  . Not on file  Social History Narrative  . Not on file   Social Determinants of Health   Financial Resource Strain: Not on file  Food Insecurity: Not on file  Transportation Needs: Not on file  Physical Activity: Not on file  Stress: Not on file  Social Connections: Not on file  Intimate Partner Violence: Not on file    Outpatient Medications Prior to Visit  Medication Sig Dispense Refill  . cholecalciferol (VITAMIN D3) 25 MCG (1000 UT) tablet Take 1,000 Units by mouth daily.    . cyclobenzaprine (FLEXERIL) 10 MG tablet Take 1 tablet (10 mg total) by mouth 2 (two)  times daily as needed for muscle spasms. 20 tablet 0  . ferrous sulfate 325 (65 FE) MG tablet Take 325 mg by mouth daily with breakfast.    . ibuprofen (ADVIL) 800 MG tablet Take 1 tablet (800 mg total) by mouth every 8 (eight) hours as needed for moderate pain. Most last 7 days. 90 tablet 3  . montelukast (SINGULAIR) 10 MG tablet TAKE 1 TABLET BY MOUTH AT BEDTIME 30 tablet 3   No facility-administered medications prior to visit.    Allergies  Allergen Reactions  . Onion     ROS Review of Systems    Objective:    Physical Exam Constitutional:      Appearance: Normal appearance.  HENT:     Head: Normocephalic and atraumatic.  Cardiovascular:     Rate and Rhythm: Normal rate and regular rhythm.     Pulses: Normal pulses.     Heart sounds: Normal heart sounds.  Pulmonary:     Effort: Pulmonary effort is normal.     Breath sounds: Normal breath sounds.  Musculoskeletal:        General: Swelling and  tenderness present.     Cervical back: Normal range of motion.     Right lower leg: No edema.     Left lower leg: No edema.     Comments: Right wrist decreased range of motion  Skin:    General: Skin is warm and dry.     Capillary Refill: Capillary refill takes less than 2 seconds.  Neurological:     General: No focal deficit present.     Mental Status: She is alert and oriented to person, place, and time.  Psychiatric:        Mood and Affect: Mood normal.        Behavior: Behavior normal.        Thought Content: Thought content normal.        Judgment: Judgment normal.     BP 115/75   Pulse 72   Temp 99.2 F (37.3 C) (Temporal)   Ht 5\' 7"  (1.702 m)   Wt 162 lb (73.5 kg)   SpO2 98%   BMI 25.37 kg/m  Wt Readings from Last 3 Encounters:  10/31/20 162 lb (73.5 kg)  07/26/20 168 lb (76.2 kg)  05/03/20 168 lb 3.2 oz (76.3 kg)     Health Maintenance Due  Topic Date Due  . COVID-19 Vaccine (3 - Booster for Moderna series) 09/16/2020    There are no preventive care reminders to display for this patient.  No results found for: TSH Lab Results  Component Value Date   WBC 4.8 10/31/2019   HGB 11.1 10/31/2019   HCT 35.0 10/31/2019   MCV 83 10/31/2019   PLT 343 10/31/2019   Lab Results  Component Value Date   NA 141 10/31/2019   K 3.6 10/31/2019   CO2 20 07/25/2019   GLUCOSE 80 10/31/2019   BUN 9 10/31/2019   CREATININE 0.78 10/31/2019   BILITOT <0.2 10/31/2019   ALKPHOS 57 10/31/2019   AST 19 10/31/2019   ALT 9 07/25/2019   PROT 6.9 10/31/2019   ALBUMIN 4.4 10/31/2019   CALCIUM 9.1 10/31/2019   Lab Results  Component Value Date   CHOL 231 (H) 10/31/2019   Lab Results  Component Value Date   HDL 103 10/31/2019   Lab Results  Component Value Date   LDLCALC 121 (H) 10/31/2019   Lab Results  Component Value Date   TRIG 44 10/31/2019  Lab Results  Component Value Date   CHOLHDL 2.2 10/31/2019   No results found for: HGBA1C    Assessment &  Plan:   Problem List Items Addressed This Visit      Other   Anemia Stable continue with current regimen and further evaluation pending   Relevant Orders   CBC with Differential/Platelet (Completed)   Iron, TIBC and Ferritin Panel (Completed)    Other Visit Diagnoses    Wrist pain, chronic, right    -  Primary Worsening steroid Dosepak and imaging along with referral Work note provided   Relevant Medications   predniSONE (DELTASONE) 20 MG tablet   Other Relevant Orders   Ambulatory referral to Orthopedics   Comp. Metabolic Panel (12) (Completed)   DG Wrist Complete Right   History of wrist fracture       Relevant Orders   Ambulatory referral to Orthopedics   Vitamin D deficiency     Stable we reevaluate   Relevant Orders   VITAMIN D 25 Hydroxy (Vit-D Deficiency, Fractures) (Completed)      Meds ordered this encounter  Medications  . Capsaicin-Menthol (SALONPAS PAIN REL GEL-PTCH HOT) 0.025-1.25 % PTCH    Sig: Apply 1 patch topically in the morning, at noon, and at bedtime.    Dispense:  60 patch    Refill:  11    Order Specific Question:   Supervising Provider    Answer:   Tresa Garter [id number]  . predniSONE (DELTASONE) 20 MG tablet    Sig: Take 3 tablets (60 mg total) by mouth daily with breakfast for 3 days, THEN 2 tablets (40 mg total) daily with breakfast for 3 days, THEN 1 tablet (20 mg total) daily with breakfast for 3 days.    Dispense:  18 tablet    Refill:  0    Order Specific Question:   Supervising Provider    Answer:   Tresa Garter [id number]    Follow-up: Return in about 3 months (around 01/31/2021).    Vevelyn Francois, NP

## 2020-10-31 NOTE — Patient Instructions (Addendum)
Wrist Pain, Adult There are many things that can cause wrist pain. Some common causes include:  An injury to the wrist.  Using the joint too much.  A condition that causes too much pressure to be put on a nerve in the wrist (carpal tunnel syndrome).  Wear and tear of the joints that happens as a person gets older (osteoarthritis).  A condition that causes swelling and stiffness in the joints (arthritis). Sometimes, the cause of wrist pain is not known. Often, the pain goes away when you follow your doctor's instructions for easing pain at home. This may include resting your wrist, icing your wrist, or using a splint or an elastic wrap for a short time. It is important to tell your doctor if your wrist pain does not go away. Follow these instructions at home: If you have a splint or elastic wrap:  Wear the splint or wrap as told by your doctor. Take it off only as told by your doctor. Ask if you can take it off for bathing.  Loosen the splint or wrap if your fingers: ? Tingle. ? Become numb. ? Turn cold and blue.  Check the skin around the splint or wrap every day. Tell your doctor about any concerns.  Keep the splint or wrap clean.  If the splint or wrap is not waterproof: ? Do not let it get wet. ? Cover it with a watertight covering when you take a bath or shower. Managing pain, stiffness, and swelling  If told, put ice on the painful area. To do this: ? If you have a removable splint or wrap, take it off as told by your doctor. ? Put ice in a plastic bag. ? Place a towel between your skin and the bag or between your splint or wrap and the bag. ? Leave the ice on for 20 minutes, 2-3 times a day.  Move your fingers often.  Raise (elevate) the injured area above the level of your heart while you are sitting or lying down.   Activity  Rest your wrist as told by your doctor.  Return to your normal activities as told by your doctor. Ask your doctor what activities are safe  for you.  Ask your doctor when it is safe to drive if you have a splint or wrap on your wrist.  Do exercises as told by your doctor. General instructions  Pay attention to any changes in your symptoms.  Take over-the-counter and prescription medicines only as told by your doctor.  Keep all follow-up visits as told by your doctor. This is important. Contact a doctor if:  You have a sudden, sharp pain in the wrist, hand, or arm that is different or new.  The swelling or bruising on your wrist or hand gets worse.  Your skin: ? Becomes red. ? Gets a rash. ? Has open sores.  Your pain does not get better.  Your pain gets worse.  You have a fever or chills. Get help right away if:  You lose feeling in your fingers or hand.  Your fingers turn white, very red, or cold and blue.  You cannot move your fingers. Summary  There are many things that can cause wrist pain.  It is important to tell your doctor if your wrist pain does not go away.  You may need to wear a splint or a wrap for a short period of time.  Return to your normal activities as told by your doctor. Ask your doctor   what activities are safe for you. This information is not intended to replace advice given to you by your health care provider. Make sure you discuss any questions you have with your health care provider. Document Revised: 05/05/2019 Document Reviewed: 05/05/2019 Elsevier Patient Education  2021 [place] [phone]

## 2020-11-01 LAB — COMP. METABOLIC PANEL (12)
AST: 18 IU/L (ref 0–40)
Albumin/Globulin Ratio: 1.9 (ref 1.2–2.2)
Albumin: 4.6 g/dL (ref 3.8–4.8)
Alkaline Phosphatase: 42 IU/L — ABNORMAL LOW (ref 44–121)
BUN/Creatinine Ratio: 21 (ref 9–23)
BUN: 19 mg/dL (ref 6–24)
Bilirubin Total: <0.2 mg/dL (ref 0.0–1.2)
Calcium: 9.1 mg/dL (ref 8.7–10.2)
Chloride: 103 mmol/L (ref 96–106)
Creatinine, Ser: 0.9 mg/dL (ref 0.57–1.00)
Globulin, Total: 2.4 g/dL (ref 1.5–4.5)
Glucose: 89 mg/dL (ref 65–99)
Potassium: 4.3 mmol/L (ref 3.5–5.2)
Sodium: 140 mmol/L (ref 134–144)
Total Protein: 7 g/dL (ref 6.0–8.5)
eGFR: 80 mL/min/{1.73_m2} (ref >59)

## 2020-11-01 LAB — IRON,TIBC AND FERRITIN PANEL
Ferritin: 134 ng/mL (ref 15–150)
Iron Saturation: 26 % (ref 15–55)
Iron: 97 ug/dL (ref 27–159)
Total Iron Binding Capacity: 378 ug/dL (ref 250–450)
UIBC: 281 ug/dL (ref 131–425)

## 2020-11-01 LAB — CBC WITH DIFFERENTIAL/PLATELET
Basophils Absolute: 0.1 10*3/uL (ref 0.0–0.2)
Basos: 1 %
EOS (ABSOLUTE): 0.2 10*3/uL (ref 0.0–0.4)
Eos: 5 %
Hematocrit: 35.1 % (ref 34.0–46.6)
Hemoglobin: 11.3 g/dL (ref 11.1–15.9)
Immature Grans (Abs): 0 10*3/uL (ref 0.0–0.1)
Immature Granulocytes: 0 %
Lymphocytes Absolute: 2.3 10*3/uL (ref 0.7–3.1)
Lymphs: 45 %
MCH: 26.7 pg (ref 26.6–33.0)
MCHC: 32.2 g/dL (ref 31.5–35.7)
MCV: 83 fL (ref 79–97)
Monocytes Absolute: 0.3 10*3/uL (ref 0.1–0.9)
Monocytes: 7 %
Neutrophils Absolute: 2.1 10*3/uL (ref 1.4–7.0)
Neutrophils: 42 %
Platelets: 355 10*3/uL (ref 150–450)
RBC: 4.23 x10E6/uL (ref 3.77–5.28)
RDW: 13 % (ref 11.7–15.4)
WBC: 5 10*3/uL (ref 3.4–10.8)

## 2020-11-01 LAB — VITAMIN D 25 HYDROXY (VIT D DEFICIENCY, FRACTURES): Vit D, 25-Hydroxy: 27.1 ng/mL — ABNORMAL LOW (ref 30.0–100.0)

## 2020-11-02 ENCOUNTER — Other Ambulatory Visit: Payer: Self-pay

## 2020-11-02 ENCOUNTER — Encounter: Payer: Self-pay | Admitting: Orthopaedic Surgery

## 2020-11-02 ENCOUNTER — Ambulatory Visit (INDEPENDENT_AMBULATORY_CARE_PROVIDER_SITE_OTHER): Payer: Medicaid Other | Admitting: Orthopaedic Surgery

## 2020-11-02 DIAGNOSIS — M25531 Pain in right wrist: Secondary | ICD-10-CM

## 2020-11-02 NOTE — Progress Notes (Signed)
Office Visit Note   Patient: Melanie Ortiz           Date of Birth: Nov 04, 1975           MRN: [id number] Visit Date: 11/02/2020              Requested by: Vevelyn Francois, NP [address] [place],  [place] [address] PCP: Vevelyn Francois, NP   Assessment & Plan: Visit Diagnoses:  1. Pain in right wrist     Plan: Impression is right hand pain questionable carpal tunnel syndrome.  We will obtain EMGs.  Arthritis panel obtained today.  Follow-up after the EMG.  For now she will continue to wear the wrist brace at night as she has noticed some relief.  Follow-Up Instructions: Return for After EMG.   Orders:  Orders Placed This Encounter  Procedures  . Antinuclear Antib (ANA)  . Rheumatoid Factor  . Sed Rate (ESR)  . Uric acid  . Ambulatory referral to Physical Medicine Rehab   No orders of the defined types were placed in this encounter.     Procedures: No procedures performed   Clinical Data: No additional findings.   Subjective: Chief Complaint  Patient presents with  . Right Wrist - Pain    Melanie Ortiz is a 45 year old female here for evaluation of right wrist pain that started about a month ago.  She had a remote right distal radius fracture as a child that was treated with closed reduction and casting.  She reports symptoms of soreness in the morning and occasional hands falling asleep and dropping things out of her hands.  It also aches with cold weather.  She feels the ibuprofen naproxen the and prednisone have only helped a little bit she is right-hand dominant.   Review of Systems  Constitutional: Negative.   HENT: Negative.   Eyes: Negative.   Respiratory: Negative.   Cardiovascular: Negative.   Endocrine: Negative.   Musculoskeletal: Negative.   Neurological: Negative.   Hematological: Negative.   Psychiatric/Behavioral: Negative.   All other systems reviewed and are negative.    Objective: Vital Signs: There were no vitals taken for this  visit.  Physical Exam Vitals and nursing note reviewed.  Constitutional:      Appearance: She is well-developed.  HENT:     Head: Normocephalic and atraumatic.  Pulmonary:     Effort: Pulmonary effort is normal.  Abdominal:     Palpations: Abdomen is soft.  Musculoskeletal:     Cervical back: Neck supple.  Skin:    General: Skin is warm.     Capillary Refill: Capillary refill takes less than 2 seconds.  Neurological:     Mental Status: She is alert and oriented to person, place, and time.  Psychiatric:        Behavior: Behavior normal.        Thought Content: Thought content normal.        Judgment: Judgment normal.     Ortho Exam Right wrist shows preserved range of motion.  Equivocal carpal tunnel compressive signs.  Slight tenderness to her fingers.  Able to make a full composite fist.  Normal temperature and turgor. Specialty Comments:  No specialty comments available.  Imaging: No results found.   PMFS History: Patient Active Problem List   Diagnosis Date Noted  . Uterine leiomyoma 07/25/2019  . Menorrhagia 07/25/2019  . Dysmenorrhea 07/25/2019  . Anemia 07/25/2019  . Facial swelling 08/25/2018  . Allergic reaction 08/25/2018  .  Bacterial vaginosis 05/17/2015   Past Medical History:  Diagnosis Date  . Allergic reaction   . Anemia   . COVID   . Facial swelling   . Neuromuscular disorder ([place]) 2015   bilateral legs and hands- spasms.     Family History  Problem Relation Age of Onset  . Cancer Mother        cervical   . GER disease Mother   . Diabetes Brother   . Ovarian cysts Daughter   . ADD / ADHD Son   . Breast cancer Cousin     Past Surgical History:  Procedure Laterality Date  . ABDOMINAL HYSTERECTOMY     04/25/2015   Social History   Occupational History  . Not on file  Tobacco Use  . Smoking status: Never Smoker  . Smokeless tobacco: Former Systems developer    Types: Snuff  Vaping Use  . Vaping Use: Never used  Substance and Sexual  Activity  . Alcohol use: Not Currently    Alcohol/week: 3.0 standard drinks    Types: 3 Cans of beer per week    Comment: everyday   . Drug use: Never  . Sexual activity: Yes    Birth control/protection: Post-menopausal

## 2020-11-05 LAB — RHEUMATOID FACTOR: Rheumatoid fact SerPl-aCnc: <14 IU/mL (ref <14)

## 2020-11-05 LAB — SEDIMENTATION RATE: Sed Rate: 2 mm/h (ref 0–20)

## 2020-11-05 LAB — URIC ACID: Uric Acid, Serum: 3.9 mg/dL (ref 2.5–7.0)

## 2020-11-05 LAB — ANA: Anti Nuclear Antibody (ANA): NEGATIVE

## 2020-11-05 NOTE — Progress Notes (Signed)
Lab work all normal.

## 2020-11-12 ENCOUNTER — Other Ambulatory Visit: Payer: Self-pay

## 2020-11-12 MED ORDER — MONTELUKAST SODIUM 10 MG PO TABS: 1.0000 | ORAL_TABLET | Freq: Every day | ORAL | 3 refills | Status: DC

## 2020-12-14 ENCOUNTER — Other Ambulatory Visit: Payer: Self-pay | Admitting: Nurse Practitioner

## 2020-12-14 DIAGNOSIS — R921 Mammographic calcification found on diagnostic imaging of breast: Secondary | ICD-10-CM

## 2020-12-14 DIAGNOSIS — N63 Unspecified lump in unspecified breast: Secondary | ICD-10-CM

## 2020-12-21 ENCOUNTER — Other Ambulatory Visit: Payer: Self-pay

## 2020-12-21 ENCOUNTER — Ambulatory Visit
Admission: RE | Admit: 2020-12-21 | Discharge: 2020-12-21 | Disposition: A | Discharge status: Home / Self Care | Payer: Medicaid Other | Source: Ambulatory Visit | Attending: Nurse Practitioner | Admitting: Nurse Practitioner

## 2020-12-21 ENCOUNTER — Other Ambulatory Visit: Payer: Self-pay | Admitting: Nurse Practitioner

## 2020-12-21 DIAGNOSIS — R921 Mammographic calcification found on diagnostic imaging of breast: Secondary | ICD-10-CM

## 2020-12-21 DIAGNOSIS — R922 Inconclusive mammogram: Secondary | ICD-10-CM | POA: Diagnosis not present

## 2020-12-21 DIAGNOSIS — N6323 Unspecified lump in the left breast, lower outer quadrant: Secondary | ICD-10-CM | POA: Diagnosis not present

## 2020-12-21 DIAGNOSIS — N63 Unspecified lump in unspecified breast: Secondary | ICD-10-CM

## 2020-12-21 DIAGNOSIS — N6321 Unspecified lump in the left breast, upper outer quadrant: Secondary | ICD-10-CM | POA: Diagnosis not present

## 2020-12-25 ENCOUNTER — Other Ambulatory Visit: Payer: Self-pay | Admitting: Nurse Practitioner

## 2020-12-25 ENCOUNTER — Ambulatory Visit
Admission: RE | Admit: 2020-12-25 | Discharge: 2020-12-25 | Disposition: A | Discharge status: Home / Self Care | Payer: Medicaid Other | Source: Ambulatory Visit | Attending: Nurse Practitioner | Admitting: Nurse Practitioner

## 2020-12-25 ENCOUNTER — Telehealth: Payer: Self-pay | Admitting: Nurse Practitioner

## 2020-12-25 ENCOUNTER — Other Ambulatory Visit: Payer: Self-pay

## 2020-12-25 DIAGNOSIS — N63 Unspecified lump in unspecified breast: Secondary | ICD-10-CM

## 2020-12-25 DIAGNOSIS — N6325 Unspecified lump in the left breast, overlapping quadrants: Secondary | ICD-10-CM | POA: Diagnosis not present

## 2020-12-25 DIAGNOSIS — R921 Mammographic calcification found on diagnostic imaging of breast: Secondary | ICD-10-CM

## 2020-12-25 HISTORY — PX: BREAST BIOPSY: SHX20

## 2020-12-25 NOTE — Telephone Encounter (Signed)
Left voice mail to call back 

## 2020-12-25 NOTE — Telephone Encounter (Signed)
Amillia said could you call her about her condition and her # [phone]

## 2020-12-26 ENCOUNTER — Other Ambulatory Visit: Payer: Self-pay | Admitting: Nurse Practitioner

## 2020-12-26 ENCOUNTER — Other Ambulatory Visit: Payer: Self-pay | Admitting: Obstetrics

## 2020-12-26 ENCOUNTER — Encounter: Payer: Self-pay | Admitting: Nurse Practitioner

## 2020-12-26 NOTE — Progress Notes (Signed)
   [facility] [address] [place], [place]  [address] Phone:  [phone]   Fax:  [phone] 

## 2020-12-28 ENCOUNTER — Encounter: Payer: Self-pay | Admitting: Physical Medicine and Rehabilitation

## 2020-12-28 ENCOUNTER — Other Ambulatory Visit: Payer: Self-pay

## 2020-12-28 ENCOUNTER — Ambulatory Visit (INDEPENDENT_AMBULATORY_CARE_PROVIDER_SITE_OTHER): Payer: Medicaid Other | Admitting: Physical Medicine and Rehabilitation

## 2020-12-28 DIAGNOSIS — R202 Paresthesia of skin: Secondary | ICD-10-CM

## 2020-12-28 NOTE — Progress Notes (Signed)
Stiffness and soreness in right hand in the mornings. History of right wrist fracture. Sometimes has swelling in fingers. Right hand dominant +lotion- baby oil Numeric Pain Rating Scale and Functional Assessment Average Pain 10   In the last MONTH (on 0-10 scale) has pain interfered with the following?  1. General activity like being  able to carry out your everyday physical activities such as walking, climbing stairs, carrying groceries, or moving a chair?  Rating(6)

## 2021-01-07 NOTE — Procedures (Signed)
EMG & NCV Findings: All nerve conduction studies (as indicated in the following tables) were within normal limits.    All examined muscles (as indicated in the following table) showed no evidence of electrical instability.    Impression: Essentially NORMAL electrodiagnostic study of the right upper limb.  There is no significant electrodiagnostic evidence of nerve entrapment, brachial plexopathy or cervical radiculopathy.  As you know, purely sensory or demyelinating radiculopathies and chemical radiculitis may not be detected with this particular electrodiagnostic study.  Recommendations: 1.  Follow-up with referring physician. 2.  Continue current management of symptoms.  ___________________________ Laurence Spates FAAPMR Board Certified, American Board of Physical Medicine and Rehabilitation    Nerve Conduction Studies Anti Sensory Summary Table   Stim Site NR Peak (ms) Norm Peak (ms) P-T Amp (V) Norm P-T Amp Site1 Site2 Delta-P (ms) Dist (cm) Vel (m/s) Norm Vel (m/s)  Right Median Acr Palm Anti Sensory (2nd Digit)  32C  Wrist    3.5 <3.6 33.3 >10 Wrist Palm 1.5 0.0    Palm    2.0 <2.0 12.1         Right Radial Anti Sensory (Base 1st Digit)  32.3C  Wrist    2.4 <3.1 26.0  Wrist Base 1st Digit 2.4 0.0    Right Ulnar Anti Sensory (5th Digit)  31.9C  Wrist    3.5 <3.7 33.4 >15.0 Wrist 5th Digit 3.5 14.0 40 >38   Motor Summary Table   Stim Site NR Onset (ms) Norm Onset (ms) O-P Amp (mV) Norm O-P Amp Site1 Site2 Delta-0 (ms) Dist (cm) Vel (m/s) Norm Vel (m/s)  Right Median Motor (Abd Poll Brev)  31.9C  Wrist    3.3 <4.2 11.0 >5 Elbow Wrist 4.3 24.0 56 >50  Elbow    7.6  10.1         Right Ulnar Motor (Abd Dig Min)  30.9C  Wrist    2.9 <4.2 10.1 >3 B Elbow Wrist 3.7 22.0 59 >53  B Elbow    6.6  10.3  A Elbow B Elbow 1.1 10.0 91 >53  A Elbow    7.7  10.6          EMG   Side Muscle Nerve Root Ins Act Fibs Psw Amp Dur Poly Recrt Int Fraser Din Comment  Right Abd Poll Brev Median  C8-T1 Nml Nml Nml Nml Nml 0 Nml Nml   Right 1stDorInt Ulnar C8-T1 Nml Nml Nml Nml Nml 0 Nml Nml   Right PronatorTeres Median C6-7 Nml Nml Nml Nml Nml 0 Nml Nml   Right Biceps Musculocut C5-6 Nml Nml Nml Nml Nml 0 Nml Nml   Right Deltoid Axillary C5-6 Nml Nml Nml Nml Nml 0 Nml Nml     Nerve Conduction Studies Anti Sensory Left/Right Comparison   Stim Site L Lat (ms) R Lat (ms) L-R Lat (ms) L Amp (V) R Amp (V) L-R Amp (%) Site1 Site2 L Vel (m/s) R Vel (m/s) L-R Vel (m/s)  Median Acr Palm Anti Sensory (2nd Digit)  32C  Wrist  3.5   33.3  Wrist Palm     Palm  2.0   12.1        Radial Anti Sensory (Base 1st Digit)  32.3C  Wrist  2.4   26.0  Wrist Base 1st Digit     Ulnar Anti Sensory (5th Digit)  31.9C  Wrist  3.5   33.4  Wrist 5th Digit  40    Motor Left/Right Comparison   Stim  Site L Lat (ms) R Lat (ms) L-R Lat (ms) L Amp (mV) R Amp (mV) L-R Amp (%) Site1 Site2 L Vel (m/s) R Vel (m/s) L-R Vel (m/s)  Median Motor (Abd Poll Brev)  31.9C  Wrist  3.3   11.0  Elbow Wrist  56   Elbow  7.6   10.1        Ulnar Motor (Abd Dig Min)  30.9C  Wrist  2.9   10.1  B Elbow Wrist  59   B Elbow  6.6   10.3  A Elbow B Elbow  91   A Elbow  7.7   10.6           Waveforms:

## 2021-01-07 NOTE — Progress Notes (Signed)
Melanie Ortiz - 45 y.o. female MRN [id number]  Date of birth: 16-Aug-1975  Office Visit Note: Visit Date: 12/28/2020 PCP: Vevelyn Francois, NP Referred by: Vevelyn Francois, NP  Subjective: Chief Complaint  Patient presents with   Right Hand - Pain   HPI:  Melanie Ortiz is a 45 y.o. female who comes in today at the request of Dr. Eduard Roux for electrodiagnostic study of the Right upper extremities.  Patient is Right hand dominant.  She reports 10 out of 10 pain and stiffness and soreness of the right hand particularly in the morning.  She reports a remote history of right distal radius fracture as a child treated with casting and reduction which was a closed reduction.  She reports sometimes that she feels like her hands fall asleep and that she is dropping objects at times.  She also feels worse with the weather.  She does get swelling.  She has had no prior electrodiagnostic studies.  No frank radicular symptoms.  No history of diabetes.  ROS Otherwise per HPI.  Assessment & Plan: Visit Diagnoses:    ICD-10-CM   1. Paresthesia of skin  R20.2 NCV with EMG (electromyography)      Plan: Impression: Essentially NORMAL electrodiagnostic study of the right upper limb.  There is no significant electrodiagnostic evidence of nerve entrapment, brachial plexopathy or cervical radiculopathy.  As you know, purely sensory or demyelinating radiculopathies and chemical radiculitis may not be detected with this particular electrodiagnostic study.  Recommendations: 1.  Follow-up with referring physician. 2.  Continue current management of symptoms.  Meds & Orders: No orders of the defined types were placed in this encounter.   Orders Placed This Encounter  Procedures   NCV with EMG (electromyography)    Follow-up: Return in about 2 weeks (around 01/11/2021) for  Eduard Roux, MD.   Procedures: No procedures performed  EMG & NCV Findings: All nerve conduction studies (as indicated in the  following tables) were within normal limits.    All examined muscles (as indicated in the following table) showed no evidence of electrical instability.    Impression: Essentially NORMAL electrodiagnostic study of the right upper limb.  There is no significant electrodiagnostic evidence of nerve entrapment, brachial plexopathy or cervical radiculopathy.  As you know, purely sensory or demyelinating radiculopathies and chemical radiculitis may not be detected with this particular electrodiagnostic study.  Recommendations: 1.  Follow-up with referring physician. 2.  Continue current management of symptoms.  ___________________________ Laurence Spates FAAPMR Board Certified, American Board of Physical Medicine and Rehabilitation    Nerve Conduction Studies Anti Sensory Summary Table   Stim Site NR Peak (ms) Norm Peak (ms) P-T Amp (V) Norm P-T Amp Site1 Site2 Delta-P (ms) Dist (cm) Vel (m/s) Norm Vel (m/s)  Right Median Acr Palm Anti Sensory (2nd Digit)  32C  Wrist    3.5 <3.6 33.3 >10 Wrist Palm 1.5 0.0    Palm    2.0 <2.0 12.1         Right Radial Anti Sensory (Base 1st Digit)  32.3C  Wrist    2.4 <3.1 26.0  Wrist Base 1st Digit 2.4 0.0    Right Ulnar Anti Sensory (5th Digit)  31.9C  Wrist    3.5 <3.7 33.4 >15.0 Wrist 5th Digit 3.5 14.0 40 >38   Motor Summary Table   Stim Site NR Onset (ms) Norm Onset (ms) O-P Amp (mV) Norm O-P Amp Site1 Site2 Delta-0 (ms) Dist (cm) Vel (m/s) Norm  Vel (m/s)  Right Median Motor (Abd Poll Brev)  31.9C  Wrist    3.3 <4.2 11.0 >5 Elbow Wrist 4.3 24.0 56 >50  Elbow    7.6  10.1         Right Ulnar Motor (Abd Dig Min)  30.9C  Wrist    2.9 <4.2 10.1 >3 B Elbow Wrist 3.7 22.0 59 >53  B Elbow    6.6  10.3  A Elbow B Elbow 1.1 10.0 91 >53  A Elbow    7.7  10.6          EMG   Side Muscle Nerve Root Ins Act Fibs Psw Amp Dur Poly Recrt Int Fraser Din Comment  Right Abd Poll Brev Median C8-T1 Nml Nml Nml Nml Nml 0 Nml Nml   Right 1stDorInt Ulnar C8-T1 Nml Nml  Nml Nml Nml 0 Nml Nml   Right PronatorTeres Median C6-7 Nml Nml Nml Nml Nml 0 Nml Nml   Right Biceps Musculocut C5-6 Nml Nml Nml Nml Nml 0 Nml Nml   Right Deltoid Axillary C5-6 Nml Nml Nml Nml Nml 0 Nml Nml     Nerve Conduction Studies Anti Sensory Left/Right Comparison   Stim Site L Lat (ms) R Lat (ms) L-R Lat (ms) L Amp (V) R Amp (V) L-R Amp (%) Site1 Site2 L Vel (m/s) R Vel (m/s) L-R Vel (m/s)  Median Acr Palm Anti Sensory (2nd Digit)  32C  Wrist  3.5   33.3  Wrist Palm     Palm  2.0   12.1        Radial Anti Sensory (Base 1st Digit)  32.3C  Wrist  2.4   26.0  Wrist Base 1st Digit     Ulnar Anti Sensory (5th Digit)  31.9C  Wrist  3.5   33.4  Wrist 5th Digit  40    Motor Left/Right Comparison   Stim Site L Lat (ms) R Lat (ms) L-R Lat (ms) L Amp (mV) R Amp (mV) L-R Amp (%) Site1 Site2 L Vel (m/s) R Vel (m/s) L-R Vel (m/s)  Median Motor (Abd Poll Brev)  31.9C  Wrist  3.3   11.0  Elbow Wrist  56   Elbow  7.6   10.1        Ulnar Motor (Abd Dig Min)  30.9C  Wrist  2.9   10.1  B Elbow Wrist  59   B Elbow  6.6   10.3  A Elbow B Elbow  91   A Elbow  7.7   10.6           Waveforms:            Clinical History: No specialty comments available.     Objective:  VS:  HT:    WT:   BMI:     BP:   HR: bpm  TEMP: ( )  RESP:  Physical Exam Musculoskeletal:        General: No swelling, tenderness or deformity.     Comments: Inspection reveals no atrophy of the bilateral APB or FDI or hand intrinsics.  No synovitis noted.  There is no swelling, color changes, allodynia or dystrophic changes. There is 5 out of 5 strength in the bilateral wrist extension, finger abduction and long finger flexion. There is intact sensation to light touch in all dermatomal and peripheral nerve distributions. There is a negative Phalen's test bilaterally. There is a negative Hoffmann's test bilaterally.  Skin:    General: Skin is warm  and dry.     Findings: No erythema or rash.  Neurological:      General: No focal deficit present.     Mental Status: She is alert and oriented to person, place, and time.     Motor: No weakness or abnormal muscle tone.     Coordination: Coordination normal.  Psychiatric:        Mood and Affect: Mood normal.        Behavior: Behavior normal.     Imaging: No results found.

## 2021-01-15 ENCOUNTER — Ambulatory Visit: Payer: Medicaid Other | Admitting: Orthopaedic Surgery

## 2021-01-31 ENCOUNTER — Ambulatory Visit: Payer: Medicaid Other | Admitting: Nurse Practitioner

## 2021-02-20 ENCOUNTER — Other Ambulatory Visit: Payer: Self-pay

## 2021-02-20 ENCOUNTER — Ambulatory Visit (HOSPITAL_COMMUNITY)
Admission: EM | Admit: 2021-02-20 | Discharge: 2021-02-20 | Disposition: A | Discharge status: Home / Self Care | Payer: Medicaid Other | Attending: Emergency Medicine | Admitting: Emergency Medicine

## 2021-02-20 ENCOUNTER — Encounter (HOSPITAL_COMMUNITY): Payer: Self-pay

## 2021-02-20 DIAGNOSIS — S39012A Strain of muscle, fascia and tendon of lower back, initial encounter: Secondary | ICD-10-CM | POA: Diagnosis not present

## 2021-02-20 MED ORDER — METHYLPREDNISOLONE 4 MG PO TBPK: ORAL_TABLET | Freq: Every day | ORAL | 0 refills | Status: DC

## 2021-02-20 MED ORDER — TIZANIDINE HCL 4 MG PO TABS: 4.0000 mg | ORAL_TABLET | Freq: Three times a day (TID) | ORAL | 0 refills | Status: DC | PRN

## 2021-02-20 NOTE — Discharge Instructions (Addendum)
800 mg of ibuprofen combined with 1000 mg of Tylenol 3 times a day.  Zanaflex for muscle spasms.  Medrol Dosepak will help with inflammation.  Many people find gentle stretching and deep tissue massage helpful. Try [facility] on [address]. They have very reasonable prices and take walk ins. Or you can go to  [facility]. Follow-up with your primary care physician as needed,  go to the ER for the signs and symptoms we discussed.  Go to www.goodrx.com  or www.costplusdrugs.com to look up your medications. This will give you a list of where you can find your prescriptions at the most affordable prices. Or ask the pharmacist what the cash price is, or if they have any other discount programs available to help make your medication more affordable. This can be less expensive than what you would pay with insurance.

## 2021-02-20 NOTE — ED Triage Notes (Signed)
Pt states she has scoliosis, and c/o back pain and right hand soreness.  Started: Back pain since Monday

## 2021-02-20 NOTE — ED Provider Notes (Signed)
HPI  SUBJECTIVE:  Melanie Ortiz is a 45 y.o. female who presents with the acute onset of bilateral low back pain after bending forward, placing heavy food onto a pallet 3 days ago without having her back brace on.  She describes the pain as achy, pinching, throbbing, intermittent, lasting 5 to 30 minutes.  She has had similar symptoms before when she pulls her back, however symptoms usually last a day and resolve with NSAIDs and using her back brace.  No saddle anesthesia, fecal or urinary incontinence, urinary retention.  No bilateral radicular pain, leg weakness, distal numbness or tingling.  She tried Naprosyn 500 mg once and ibuprofen 800 mg once with some improvement in her symptoms.  Symptoms are worse with bending forward, torso rotation, large positional changes.  No direct trauma to the back.  She has a past medical history of scoliosis, muscle spasms.  No history of diabetes.  [id number], Diona Foley, NP   Past Medical History:  Diagnosis Date   Allergic reaction    Anemia    COVID    Facial swelling    Neuromuscular disorder ([place]) 2015   bilateral legs and hands- spasms.     Past Surgical History:  Procedure Laterality Date   ABDOMINAL HYSTERECTOMY     04/25/2015    Family History  Problem Relation Age of Onset   Cancer Mother        cervical    GER disease Mother    Diabetes Brother    Ovarian cysts Daughter    ADD / ADHD Son    Breast cancer Cousin     Social History   Tobacco Use   Smoking status: Never   Smokeless tobacco: Former    Types: Snuff  Vaping Use   Vaping Use: Never used  Substance Use Topics   Alcohol use: Not Currently    Alcohol/week: 3.0 standard drinks    Types: 3 Cans of beer per week    Comment: everyday    Drug use: Never    No current facility-administered medications for this encounter.  Current Outpatient Medications:    methylPREDNISolone (MEDROL DOSEPAK) 4 MG TBPK tablet, Take by mouth daily. Follow package instructions, Disp:  21 tablet, Rfl: 0   tiZANidine (ZANAFLEX) 4 MG tablet, Take 1 tablet (4 mg total) by mouth every 8 (eight) hours as needed for muscle spasms., Disp: 30 tablet, Rfl: 0   Capsaicin-Menthol (SALONPAS PAIN REL GEL-PTCH HOT) 0.025-1.25 % PTCH, Apply 1 patch topically in the morning, at noon, and at bedtime., Disp: 60 patch, Rfl: 11   cholecalciferol (VITAMIN D3) 25 MCG (1000 UT) tablet, Take 1,000 Units by mouth daily., Disp: , Rfl:    ibuprofen (ADVIL) 800 MG tablet, Take 1 tablet (800 mg total) by mouth every 8 (eight) hours as needed for moderate pain. Most last 7 days., Disp: 90 tablet, Rfl: 3   montelukast (SINGULAIR) 10 MG tablet, Take 1 tablet (10 mg total) by mouth at bedtime., Disp: 90 tablet, Rfl: 3  Allergies  Allergen Reactions   Onion      ROS  As noted in HPI.   Physical Exam  BP 114/73 (BP Location: Left Arm)   Pulse 66   Temp 98.1 F (36.7 C) (Oral)   Resp 18   SpO2 100%   Constitutional: Well developed, well nourished, no acute distress Eyes:  EOMI, conjunctiva normal bilaterally HENT: Normocephalic, atraumatic,mucus membranes moist Respiratory: Normal inspiratory effort Cardiovascular: Normal rate GI: nondistended. No suprapubic tenderness skin: No  rash, skin intact Musculoskeletal: Diffuse L-spine tenderness.  Patient states that this is always tender.  Positive tenderness of the QL bilaterally. positive paralumbar tenderness, + muscle spasm. Bilateral lower extremities nontender, baseline ROM with intact PT pulses, No pain with int/ext rotation extension hips bilaterally. SLR negative bilateral. Sensation baseline light touch bilaterally for Pt, DTR's symmetric and intact bilaterally KJ, Motor symmetric bilateral 5/5 hip flexion, quadriceps, hamstrings, EHL, foot dorsiflexion, foot plantarflexion, gait somewhat antalgic but without apparent new ataxia.  Pain aggravated with left hip flexion against resistance.  No tenderness over SI joint, sacrum. Neurologic: Alert &  oriented x 3, no focal neuro deficits Psychiatric: Speech and behavior appropriate   ED Course   Medications - No data to display  No orders of the defined types were placed in this encounter.   No results found for this or any previous visit (from the past 24 hour(s)). No results found.  ED Clinical Impression  1. Strain of lumbar region, initial encounter     ED Assessment/Plan  No evidence of spinal cord involvement based on H&P. Pt describing typical back pain, has been < 6 week duration. No historical red flags as noted in HPI. No physical red flags such as fever,  lower extremity weakness, saddle anesthesia.  She has diffuse bony tenderness over the L-spine, but no history of direct trauma, she states that she is "always sore" in this area.  deferring imaging today  Home with Zanaflex, Medrol Dosepak, ibuprofen 800 mg combined with 1000 mg of Tylenol 3 times daily.  Patient states that she does not need a prescription for ibuprofen.  Warm soaks, gentle stretching, deep tissue massage, Salonpas patches, follow-up with PMD as needed, ER return precautions given.  Discussed  medical decision-making, and plan for follow-up with the patient.  Discussed signs and symptoms that should prompt return to the emergency department.  Patient agrees with plan.   Meds ordered this encounter  Medications   methylPREDNISolone (MEDROL DOSEPAK) 4 MG TBPK tablet    Sig: Take by mouth daily. Follow package instructions    Dispense:  21 tablet    Refill:  0   tiZANidine (ZANAFLEX) 4 MG tablet    Sig: Take 1 tablet (4 mg total) by mouth every 8 (eight) hours as needed for muscle spasms.    Dispense:  30 tablet    Refill:  0    *This clinic note was created using [employer]. Therefore, there may be occasional mistakes despite careful proofreading.  ?     Melynda Ripple, MD 02/20/21 1718

## 2021-02-28 DIAGNOSIS — D242 Benign neoplasm of left breast: Secondary | ICD-10-CM | POA: Diagnosis not present

## 2021-04-01 ENCOUNTER — Ambulatory Visit (HOSPITAL_COMMUNITY)
Admission: RE | Admit: 2021-04-01 | Discharge: 2021-04-01 | Disposition: A | Discharge status: Home / Self Care | Payer: Medicaid Other | Source: Ambulatory Visit | Attending: Nurse Practitioner | Admitting: Nurse Practitioner

## 2021-04-01 ENCOUNTER — Ambulatory Visit (INDEPENDENT_AMBULATORY_CARE_PROVIDER_SITE_OTHER): Payer: Medicaid Other | Admitting: Nurse Practitioner

## 2021-04-01 ENCOUNTER — Other Ambulatory Visit: Payer: Self-pay

## 2021-04-01 ENCOUNTER — Encounter: Payer: Self-pay | Admitting: Nurse Practitioner

## 2021-04-01 VITALS — BP 115/68 | HR 83 | Temp 97.9°F | Ht 67.0 in | Wt 165.0 lb

## 2021-04-01 DIAGNOSIS — D509 Iron deficiency anemia, unspecified: Secondary | ICD-10-CM

## 2021-04-01 DIAGNOSIS — M419 Scoliosis, unspecified: Secondary | ICD-10-CM

## 2021-04-01 DIAGNOSIS — K5900 Constipation, unspecified: Secondary | ICD-10-CM | POA: Diagnosis not present

## 2021-04-01 DIAGNOSIS — M545 Low back pain, unspecified: Secondary | ICD-10-CM | POA: Diagnosis not present

## 2021-04-01 DIAGNOSIS — Z23 Encounter for immunization: Secondary | ICD-10-CM | POA: Diagnosis not present

## 2021-04-01 DIAGNOSIS — J301 Allergic rhinitis due to pollen: Secondary | ICD-10-CM | POA: Diagnosis not present

## 2021-04-01 MED ORDER — DOCUSATE SODIUM 100 MG PO CAPS: 100.0000 mg | ORAL_CAPSULE | Freq: Two times a day (BID) | ORAL | 0 refills | Status: DC

## 2021-04-01 NOTE — Patient Instructions (Signed)
Low Back Sprain or Strain Rehab Ask your health care provider which exercises are safe for you. Do exercises exactly as told by your health care provider and adjust them as directed. It is normal to feel mild stretching, pulling, tightness, or discomfort as you do these exercises. Stop right away if you feel sudden pain or your pain gets worse. Do not begin these exercises until told by your health care provider. Stretching and range-of-motion exercises These exercises warm up your muscles and joints and improve the movement and flexibility of your back. These exercises also help to relieve pain, numbness, and tingling. Lumbar rotation  Lie on your back on a firm bed or the floor with your knees bent. Straighten your arms out to your sides so each arm forms a 90-degree angle (right angle) with a side of your body. Slowly move (rotate) both of your knees to one side of your body until you feel a stretch in your lower back (lumbar). Try not to let your shoulders lift off the floor. Hold this position for __________ seconds. Tense your abdominal muscles and slowly move your knees back to the starting position. Repeat this exercise on the other side of your body. Repeat __________ times. Complete this exercise __________ times a day. Single knee to chest  Lie on your back on a firm bed or the floor with both legs straight. Bend one of your knees. Use your hands to move your knee up toward your chest until you feel a gentle stretch in your lower back and buttock. Hold your leg in this position by holding on to the front of your knee. Keep your other leg as straight as possible. Hold this position for __________ seconds. Slowly return to the starting position. Repeat with your other leg. Repeat __________ times. Complete this exercise __________ times a day. Prone extension on elbows  Lie on your abdomen on a firm bed or the floor (prone position). Prop yourself up on your elbows. Use your arms  to help lift your chest up until you feel a gentle stretch in your abdomen and your lower back. This will place some of your body weight on your elbows. If this is uncomfortable, try stacking pillows under your chest. Your hips should stay down, against the surface that you are lying on. Keep your hip and back muscles relaxed. Hold this position for __________ seconds. Slowly relax your upper body and return to the starting position. Repeat __________ times. Complete this exercise __________ times a day. Strengthening exercises These exercises build strength and endurance in your back. Endurance is the ability to use your muscles for a long time, even after they get tired. Pelvic tilt This exercise strengthens the muscles that lie deep in the abdomen. Lie on your back on a firm bed or the floor with your legs extended. Bend your knees so they are pointing toward the ceiling and your feet are flat on the floor. Tighten your lower abdominal muscles to press your lower back against the floor. This motion will tilt your pelvis so your tailbone points up toward the ceiling instead of pointing to your feet or the floor. To help with this exercise, you may place a small towel under your lower back and try to push your back into the towel. Hold this position for __________ seconds. Let your muscles relax completely before you repeat this exercise. Repeat __________ times. Complete this exercise __________ times a day. Alternating arm and leg raises  Get on your hands   and knees on a firm surface. If you are on a hard floor, you may want to use padding, such as an exercise mat, to cushion your knees. Line up your arms and legs. Your hands should be directly below your shoulders, and your knees should be directly below your hips. Lift your left leg behind you. At the same time, raise your right arm and straighten it in front of you. Do not lift your leg higher than your hip. Do not lift your arm higher  than your shoulder. Keep your abdominal and back muscles tight. Keep your hips facing the ground. Do not arch your back. Keep your balance carefully, and do not hold your breath. Hold this position for __________ seconds. Slowly return to the starting position. Repeat with your right leg and your left arm. Repeat __________ times. Complete this exercise __________ times a day. Abdominal set with straight leg raise  Lie on your back on a firm bed or the floor. Bend one of your knees and keep your other leg straight. Tense your abdominal muscles and lift your straight leg up, 4-6 inches (10-15 cm) off the ground. Keep your abdominal muscles tight and hold this position for __________ seconds. Do not hold your breath. Do not arch your back. Keep it flat against the ground. Keep your abdominal muscles tense as you slowly lower your leg back to the starting position. Repeat with your other leg. Repeat __________ times. Complete this exercise __________ times a day. Single leg lower with bent knees Lie on your back on a firm bed or the floor. Tense your abdominal muscles and lift your feet off the floor, one foot at a time, so your knees and hips are bent in 90-degree angles (right angles). Your knees should be over your hips and your lower legs should be parallel to the floor. Keeping your abdominal muscles tense and your knee bent, slowly lower one of your legs so your toe touches the ground. Lift your leg back up to return to the starting position. Do not hold your breath. Do not let your back arch. Keep your back flat against the ground. Repeat with your other leg. Repeat __________ times. Complete this exercise __________ times a day. Posture and body mechanics Good posture and healthy body mechanics can help to relieve stress in your body's tissues and joints. Body mechanics refers to the movements and positions of your body while you do your daily activities. Posture is part of body  mechanics. Good posture means: Your spine is in its natural S-curve position (neutral). Your shoulders are pulled back slightly. Your head is not tipped forward (neutral). Follow these guidelines to improve your posture and body mechanics in your everyday activities. Standing  When standing, keep your spine neutral and your feet about hip-width apart. Keep a slight bend in your knees. Your ears, shoulders, and hips should line up. When you do a task in which you stand in one place for a long time, place one foot up on a stable object that is 2-4 inches (5-10 cm) high, such as a footstool. This helps keep your spine neutral. Sitting  When sitting, keep your spine neutral and keep your feet flat on the floor. Use a footrest, if necessary, and keep your thighs parallel to the floor. Avoid rounding your shoulders, and avoid tilting your head forward. When working at a desk or a computer, keep your desk at a height where your hands are slightly lower than your elbows. Slide your   chair under your desk so you are close enough to maintain good posture. When working at a computer, place your monitor at a height where you are looking straight ahead and you do not have to tilt your head forward or downward to look at the screen. Resting When lying down and resting, avoid positions that are most painful for you. If you have pain with activities such as sitting, bending, stooping, or squatting, lie in a position in which your body does not bend very much. For example, avoid curling up on your side with your arms and knees near your chest (fetal position). If you have pain with activities such as standing for a long time or reaching with your arms, lie with your spine in a neutral position and bend your knees slightly. Try the following positions: Lying on your side with a pillow between your knees. Lying on your back with a pillow under your knees. Lifting  When lifting objects, keep your feet at least  shoulder-width apart and tighten your abdominal muscles. Bend your knees and hips and keep your spine neutral. It is important to lift using the strength of your legs, not your back. Do not lock your knees straight out. Always ask for help to lift heavy or awkward objects. This information is not intended to replace advice given to you by your health care provider. Make sure you discuss any questions you have with your health care provider. Document Revised: 09/03/2020 Document Reviewed: 09/03/2020 Elsevier Patient Education  2022 [employer] Inc.  

## 2021-04-01 NOTE — Progress Notes (Signed)
Melanie Ortiz, [place]  [address] Phone:  [phone]   Fax:  [phone]   Established Patient Office Visit  Subjective:  Patient ID: Melanie Ortiz, female    DOB: 1975-07-23  Age: 45 y.o. MRN: [id number]  CC:  Chief Complaint  Patient presents with   Follow-up    3 month follow up; Pt states that she has been having lower back pains x 1 week.     HPI Melanie Ortiz presents for follow up. She  has a past medical history of Allergic reaction, Anemia, COVID, Facial swelling, and Neuromuscular disorder ([place]) (2015).   She reports that she no longer working the [employer].  This is directly related to her ongoing back pain.  She was seen approximately 6 weeks ago and the ED for the back pain.  She reports worsening in symptoms.  She is unable to walk long distances without experiencing excruciating pain.  She does continue to work full-time.  She wears her back brace during her shift.  Once she takes the brace off her pain intensifies.  She denies any pain radiating down into her lower extremities.She has suffered a fall on concrete.  She felt like this was due to weakness in her right leg which was related to her back pain.  Her last images were completed in 2020 and at that time she was diagnosed with scoliosis.  She denies any additional injuries.She is unable to keep up with her ADL's.  She is taking the [employer]. She has failed PT. She is unable to bend over. She is unable to lay of long periods on her right, left and back.    Past Medical History:  Diagnosis Date   Allergic reaction    Anemia    COVID    Facial swelling    Neuromuscular disorder ([place]anie Ort[facility]) 2015   bilateral legs and hands- spasms.     Past Surgical History:  Procedure Laterality Date   ABDOMINAL HYSTERECTOMY     04/25/2015    Family History  Problem Relation Age of Onset   Cancer Mother        cervical    GER disease Mother    Diabetes Brother    Ovarian cysts Daughter     ADD / ADHD Son    Breast cancer Cousin     Social History   Socioeconomic History   Marital status: Single    Spouse name: Not on file   Number of children: 3   Years of education: Not on file   Highest education level: 8th grade  Occupational History   Not on file  Tobacco Use   Smoking status: Never   Smokeless tobacco: Former    Types: Snuff  Vaping Use   Vaping Use: Never used  Substance and Sexual Activity   Alcohol use: Not Currently    Alcohol/week: 3.0 standard drinks    Types: 3 Cans of beer per week    Comment: everyday    Drug use: Never   Sexual activity: Yes    Birth control/protection: Post-menopausal  Other Topics Concern   Not on file  Social History Narrative   Not on file   Social Determinants of Health   Financial Resource Strain: Not on file  Food Insecurity: Not on file  Transportation Needs: Not on file  Physical Activity: Not on file  Stress: Not on file  Social Connections: Not on file  Intimate Partner Violence: Not on  file    Outpatient Medications Prior to Visit  Medication Sig Dispense Refill   Capsaicin-Menthol (SALONPAS PAIN REL GEL-PTCH HOT) 0.025-1.25 % PTCH Apply 1 patch topically in the morning, at noon, and at bedtime. 60 patch 11   cholecalciferol (VITAMIN D3) 25 MCG (1000 UT) tablet Take 1,000 Units by mouth daily.     ibuprofen (ADVIL) 800 MG tablet Take 1 tablet (800 mg total) by mouth every 8 (eight) hours as needed for moderate pain. Most last 7 days. 90 tablet 3   methylPREDNISolone (MEDROL DOSEPAK) 4 MG TBPK tablet Take by mouth daily. Follow package instructions 21 tablet 0   montelukast (SINGULAIR) 10 MG tablet Take 1 tablet (10 mg total) by mouth at bedtime. 90 tablet 3   tiZANidine (ZANAFLEX) 4 MG tablet Take 1 tablet (4 mg total) by mouth every 8 (eight) hours as needed for muscle spasms. 30 tablet 0   No facility-administered medications prior to visit.    Allergies  Allergen Reactions   Onion      ROS Review of Systems    Objective:    Physical Exam HENT:     Head: Normocephalic and atraumatic.  Cardiovascular:     Rate and Rhythm: Normal rate and regular rhythm.     Pulses: Normal pulses.     Heart sounds: Normal heart sounds.  Pulmonary:     Effort: Pulmonary effort is normal.     Breath sounds: Normal breath sounds.  Abdominal:     General: Bowel sounds are normal.     Palpations: Abdomen is soft.  Musculoskeletal:        General: Swelling (lower back) present.     Cervical back: Normal range of motion.     Right lower leg: No edema.     Left lower leg: No edema.  Skin:    General: Skin is warm.     Capillary Refill: Capillary refill takes less than 2 seconds.  Neurological:     General: No focal deficit present.     Mental Status: She is alert and oriented to person, place, and time.    BP 115/68   Pulse 83   Temp 97.9 F (36.6 C)   Ht '5\' 7"'  (1.702 m)   Wt 165 lb (74.8 kg)   SpO2 99%   BMI 25.84 kg/m  Wt Readings from Last 3 Encounters:  04/01/21 165 lb (74.8 kg)  10/31/20 162 lb (73.5 kg)  07/26/20 168 lb (76.2 kg)     Health Maintenance Due  Topic Date Due   COVID-19 Vaccine (3 - Moderna risk series) 04/16/2020    There are no preventive care reminders to display for this patient.  No results found for: TSH Lab Results  Component Value Date   WBC 5.0 10/31/2020   HGB 11.3 10/31/2020   HCT 35.1 10/31/2020   MCV 83 10/31/2020   PLT 355 10/31/2020   Lab Results  Component Value Date   NA 140 10/31/2020   K 4.3 10/31/2020   CO2 20 07/25/2019   GLUCOSE 89 10/31/2020   BUN 19 10/31/2020   CREATININE 0.90 10/31/2020   BILITOT <0.2 10/31/2020   ALKPHOS 42 (L) 10/31/2020   AST 18 10/31/2020   ALT 9 07/25/2019   PROT 7.0 10/31/2020   ALBUMIN 4.6 10/31/2020   CALCIUM 9.1 10/31/2020   EGFR 80 10/31/2020   Lab Results  Component Value Date   CHOL 231 (H) 10/31/2019   Lab Results  Component Value Date   HDL  103 10/31/2019    Lab Results  Component Value Date   LDLCALC 121 (H) 10/31/2019   Lab Results  Component Value Date   TRIG 44 10/31/2019   Lab Results  Component Value Date   CHOLHDL 2.2 10/31/2019   No results found for: HGBA1C    Assessment & Plan:   Problem List Items Addressed This Visit       Other   Anemia Stable we will continue to monitor   Other Visit Diagnoses     Scoliosis of lumbosacral spine, unspecified scoliosis type    -  Primary Worsening Updated images pending Referral to orthopedist or pain management based on imaging results.  Patient seeking additional treatment options    Relevant Orders   DG Lumbar Spine Complete   Lumbar back pain       Flu vaccine need       Relevant Orders   Flu Vaccine QUAD 80moIM (Fluarix, Fluzone & Alfiuria Quad PF) (Completed)   Non-seasonal allergic rhinitis due to pollen     Stable continue with current regimen   Constipation, unspecified constipation type     Encouraged stool softeners one to two times per day based on what was discussed Encouraged Miralax QOD to daily based on need and dicussion Encouraged hydration with water at least 8-10 8 ounce glasses per day Eat foods that have a lot of fiber, such as: Fresh fruits and vegetables. Whole grains. Beans. Eat less of foods that are high in fat, low in fiber, or overly processed Encouraged regular daily exercise starting with walking 20 minutes per day        Meds ordered this encounter  Medications   docusate sodium (STOOL SOFTENER) 100 MG capsule    Sig: Take 1 capsule (100 mg total) by mouth 2 (two) times daily.    Dispense:  10 capsule    Refill:  0    Order Specific Question:   Supervising Provider    Answer:   JTresa Garter[[id number]   Follow-up: Return in about 3 months (around 07/02/2021) for fu anemia 99213.    CVevelyn Francois NP

## 2021-04-02 ENCOUNTER — Encounter: Payer: Self-pay | Admitting: Nurse Practitioner

## 2021-04-05 ENCOUNTER — Other Ambulatory Visit: Payer: Self-pay | Admitting: Nurse Practitioner

## 2021-04-05 DIAGNOSIS — M419 Scoliosis, unspecified: Secondary | ICD-10-CM

## 2021-04-05 DIAGNOSIS — M4317 Spondylolisthesis, lumbosacral region: Secondary | ICD-10-CM

## 2021-06-04 ENCOUNTER — Other Ambulatory Visit: Payer: Self-pay | Admitting: General Surgery

## 2021-06-04 DIAGNOSIS — D242 Benign neoplasm of left breast: Secondary | ICD-10-CM

## 2021-07-03 ENCOUNTER — Encounter: Payer: Self-pay | Admitting: Nurse Practitioner

## 2021-07-03 ENCOUNTER — Other Ambulatory Visit: Payer: Self-pay

## 2021-07-03 ENCOUNTER — Ambulatory Visit (INDEPENDENT_AMBULATORY_CARE_PROVIDER_SITE_OTHER): Payer: Medicaid Other | Admitting: Nurse Practitioner

## 2021-07-03 VITALS — BP 109/68 | HR 84 | Temp 98.1°F | Ht 67.0 in | Wt 167.8 lb

## 2021-07-03 DIAGNOSIS — Z13220 Encounter for screening for lipoid disorders: Secondary | ICD-10-CM | POA: Diagnosis not present

## 2021-07-03 DIAGNOSIS — M419 Scoliosis, unspecified: Secondary | ICD-10-CM | POA: Diagnosis not present

## 2021-07-03 DIAGNOSIS — J301 Allergic rhinitis due to pollen: Secondary | ICD-10-CM

## 2021-07-03 DIAGNOSIS — Z0000 Encounter for general adult medical examination without abnormal findings: Secondary | ICD-10-CM

## 2021-07-03 LAB — GLUCOSE, POCT (MANUAL RESULT ENTRY): POC Glucose: 93 mg/dl (ref 70–99)

## 2021-07-03 MED ORDER — IBUPROFEN 800 MG PO TABS: 800.0000 mg | ORAL_TABLET | Freq: Three times a day (TID) | ORAL | 1 refills | Status: DC | PRN

## 2021-07-03 MED ORDER — TIZANIDINE HCL 4 MG PO TABS: 4.0000 mg | ORAL_TABLET | Freq: Three times a day (TID) | ORAL | 5 refills | Status: DC | PRN

## 2021-07-03 MED ORDER — MONTELUKAST SODIUM 10 MG PO TABS: 10.0000 mg | ORAL_TABLET | Freq: Every day | ORAL | 1 refills | Status: DC

## 2021-07-03 NOTE — Progress Notes (Signed)
[place], [place]  [address] Phone:  [phone]   Fax:  [phone]   Established Patient Office Visit  Subjective:  Patient ID: Melanie Ortiz, female    DOB: 1976-06-24  Age: 46 y.o. MRN: [id number]  CC:  Chief Complaint  Patient presents with   Follow-up    3 month follow up. Pt also needs some testing done for her job. Pt has the paper with the list of testing that needs to be done. Pt states that she has productive cough x 1 week and she has been taking OTC medication but nothing is helping.    HPI Melanie Ortiz presents for follow up. She  has a past medical history of Allergic reaction, Anemia, COVID, Facial swelling, and Neuromuscular disorder ([place]) (2015).   She is in for follow-up.  She is starting a new job and needs some paperwork completed.  Denies headache, dizziness, visual changes, shortness of breath, dyspnea on exertion, chest pain, nausea, vomiting or any edema.   Past Medical History:  Diagnosis Date   Allergic reaction    Anemia    COVID    Facial swelling    Neuromuscular disorder ([place]) 2015   bilateral legs and hands- spasms.     Past Surgical History:  Procedure Laterality Date   ABDOMINAL HYSTERECTOMY     04/25/2015    Family History  Problem Relation Age of Onset   Cancer Mother        cervical    GER disease Mother    Diabetes Brother    Ovarian cysts Daughter    ADD / ADHD Son    Breast cancer Cousin     Social History   Socioeconomic History   Marital status: Single    Spouse name: Not on file   Number of children: 3   Years of education: Not on file   Highest education level: 8th grade  Occupational History   Not on file  Tobacco Use   Smoking status: Never   Smokeless tobacco: Former    Types: Snuff  Vaping Use   Vaping Use: Never used  Substance and Sexual Activity   Alcohol use: Not Currently    Alcohol/week: 3.0 standard drinks    Types: 3 Cans of beer per week     Comment: everyday    Drug use: Never   Sexual activity: Yes    Birth control/protection: Post-menopausal  Other Topics Concern   Not on file  Social History Narrative   Not on file   Social Determinants of Health   Financial Resource Strain: Not on file  Food Insecurity: Not on file  Transportation Needs: Not on file  Physical Activity: Not on file  Stress: Not on file  Social Connections: Not on file  Intimate Partner Violence: Not on file    Outpatient Medications Prior to Visit  Medication Sig Dispense Refill   Capsaicin-Menthol (SALONPAS PAIN REL GEL-PTCH HOT) 0.025-1.25 % PTCH Apply 1 patch topically in the morning, at noon, and at bedtime. 60 patch 11   cholecalciferol (VITAMIN D3) 25 MCG (1000 UT) tablet Take 1,000 Units by mouth daily.     docusate sodium (STOOL SOFTENER) 100 MG capsule Take 1 capsule (100 mg total) by mouth 2 (two) times daily. 10 capsule 0   methylPREDNISolone (MEDROL DOSEPAK) 4 MG TBPK tablet Take by mouth daily. Follow package instructions 21 tablet 0   montelukast (SINGULAIR) 10 MG tablet Take 1 tablet (  10 mg total) by mouth at bedtime. 90 tablet 3   tiZANidine (ZANAFLEX) 4 MG tablet Take 1 tablet (4 mg total) by mouth every 8 (eight) hours as needed for muscle spasms. 30 tablet 0   ibuprofen (ADVIL) 800 MG tablet Take 1 tablet (800 mg total) by mouth every 8 (eight) hours as needed for moderate pain. Most last 7 days. 90 tablet 3   No facility-administered medications prior to visit.    Allergies  Allergen Reactions   Onion     ROS Review of Systems    Objective:    Physical Exam HENT:     Head: Normocephalic and atraumatic.  Cardiovascular:     Rate and Rhythm: Normal rate and regular rhythm.     Pulses: Normal pulses.     Heart sounds: Normal heart sounds.  Pulmonary:     Effort: Pulmonary effort is normal.     Breath sounds: Normal breath sounds.  Abdominal:     General: Bowel sounds are normal.     Palpations: Abdomen is  soft.  Musculoskeletal:     Cervical back: Normal range of motion.     Right lower leg: No edema.     Left lower leg: No edema.  Skin:    General: Skin is warm.     Capillary Refill: Capillary refill takes less than 2 seconds.  Neurological:     General: No focal deficit present.     Mental Status: She is alert and oriented to person, place, and time.  Psychiatric:        Mood and Affect: Mood normal.        Behavior: Behavior normal.    BP 109/68    Pulse 84    Temp 98.1 F (36.7 C)    Ht '5\' 7"'  (1.702 m)    Wt 167 lb 12.8 oz (76.1 kg)    SpO2 100%    BMI 26.28 kg/m  Wt Readings from Last 3 Encounters:  07/03/21 167 lb 12.8 oz (76.1 kg)  04/01/21 165 lb (74.8 kg)  10/31/20 162 lb (73.5 kg)     There are no preventive care reminders to display for this patient.   There are no preventive care reminders to display for this patient.  No results found for: TSH Lab Results  Component Value Date   WBC 5.0 10/31/2020   HGB 11.3 10/31/2020   HCT 35.1 10/31/2020   MCV 83 10/31/2020   PLT 355 10/31/2020   Lab Results  Component Value Date   NA 140 10/31/2020   K 4.3 10/31/2020   CO2 20 07/25/2019   GLUCOSE 89 10/31/2020   BUN 19 10/31/2020   CREATININE 0.90 10/31/2020   BILITOT <0.2 10/31/2020   ALKPHOS 42 (L) 10/31/2020   AST 18 10/31/2020   ALT 9 07/25/2019   PROT 7.0 10/31/2020   ALBUMIN 4.6 10/31/2020   CALCIUM 9.1 10/31/2020   EGFR 80 10/31/2020   Lab Results  Component Value Date   CHOL 231 (H) 10/31/2019   Lab Results  Component Value Date   HDL 103 10/31/2019   Lab Results  Component Value Date   LDLCALC 121 (H) 10/31/2019   Lab Results  Component Value Date   TRIG 44 10/31/2019   Lab Results  Component Value Date   CHOLHDL 2.2 07/03/2021   No results found for: HGBA1C    Assessment & Plan:   Problem List Items Addressed This Visit   None Visit Diagnoses  Non-seasonal allergic rhinitis due to pollen    -  Primary Education  provided    Relevant Medications   montelukast (SINGULAIR) 10 MG tablet   Other Relevant Orders   Comp. Metabolic Panel (12) (Completed)   Screening for cholesterol level       Relevant Orders   Lipid panel (Completed)   Healthcare maintenance        Relevant Orders   Glucose (CBG) (Completed)   Scoliosis of lumbosacral spine, unspecified scoliosis type     Stable   Relevant Medications   ibuprofen (ADVIL) 800 MG tablet   tiZANidine (ZANAFLEX) 4 MG tablet       Meds ordered this encounter  Medications   ibuprofen (ADVIL) 800 MG tablet    Sig: Take 1 tablet (800 mg total) by mouth every 8 (eight) hours as needed for moderate pain. Most last 7 days.    Dispense:  90 tablet    Refill:  1    For acute post-operative pain. Not to be refilled.  Most last 7 days.   montelukast (SINGULAIR) 10 MG tablet    Sig: Take 1 tablet (10 mg total) by mouth at bedtime.    Dispense:  90 tablet    Refill:  1   tiZANidine (ZANAFLEX) 4 MG tablet    Sig: Take 1 tablet (4 mg total) by mouth every 8 (eight) hours as needed for muscle spasms.    Dispense:  30 tablet    Refill:  5    Follow-up: Return in about 3 months (around 10/01/2021) for [place] [[id number]].    Vevelyn Francois, NP

## 2021-07-03 NOTE — Patient Instructions (Signed)

## 2021-07-04 LAB — LIPID PANEL
Chol/HDL Ratio: 2.2 ratio (ref 0.0–4.4)
Cholesterol, Total: 205 mg/dL — ABNORMAL HIGH (ref 100–199)
HDL: 92 mg/dL (ref >39)
LDL Chol Calc (NIH): 89 mg/dL (ref 0–99)
Triglycerides: 146 mg/dL (ref 0–149)
VLDL Cholesterol Cal: 24 mg/dL (ref 5–40)

## 2021-07-04 LAB — COMP. METABOLIC PANEL (12)
AST: 13 IU/L (ref 0–40)
Albumin/Globulin Ratio: 2.1 (ref 1.2–2.2)
Albumin: 4.6 g/dL (ref 3.8–4.8)
Alkaline Phosphatase: 46 IU/L (ref 44–121)
BUN/Creatinine Ratio: 21 (ref 9–23)
BUN: 16 mg/dL (ref 6–24)
Bilirubin Total: <0.2 mg/dL (ref 0.0–1.2)
Calcium: 9 mg/dL (ref 8.7–10.2)
Chloride: 103 mmol/L (ref 96–106)
Creatinine, Ser: 0.77 mg/dL (ref 0.57–1.00)
Globulin, Total: 2.2 g/dL (ref 1.5–4.5)
Glucose: 98 mg/dL (ref 70–99)
Potassium: 4 mmol/L (ref 3.5–5.2)
Sodium: 139 mmol/L (ref 134–144)
Total Protein: 6.8 g/dL (ref 6.0–8.5)
eGFR: 96 mL/min/{1.73_m2} (ref >59)

## 2021-07-12 ENCOUNTER — Other Ambulatory Visit: Payer: Medicaid Other

## 2021-07-19 ENCOUNTER — Other Ambulatory Visit: Payer: Self-pay | Admitting: General Surgery

## 2021-07-19 ENCOUNTER — Ambulatory Visit
Admission: RE | Admit: 2021-07-19 | Discharge: 2021-07-19 | Disposition: A | Discharge status: Home / Self Care | Payer: Medicaid Other | Source: Ambulatory Visit | Attending: General Surgery | Admitting: General Surgery

## 2021-07-19 ENCOUNTER — Ambulatory Visit: Admission: RE | Admit: 2021-07-19 | Payer: Medicaid Other | Source: Ambulatory Visit

## 2021-07-19 DIAGNOSIS — N6310 Unspecified lump in the right breast, unspecified quadrant: Secondary | ICD-10-CM

## 2021-07-19 DIAGNOSIS — R921 Mammographic calcification found on diagnostic imaging of breast: Secondary | ICD-10-CM

## 2021-07-19 DIAGNOSIS — R922 Inconclusive mammogram: Secondary | ICD-10-CM | POA: Diagnosis not present

## 2021-07-19 DIAGNOSIS — N6323 Unspecified lump in the left breast, lower outer quadrant: Secondary | ICD-10-CM | POA: Diagnosis not present

## 2021-07-19 DIAGNOSIS — D242 Benign neoplasm of left breast: Secondary | ICD-10-CM

## 2021-08-05 ENCOUNTER — Ambulatory Visit: Payer: Medicaid Other | Admitting: Pain Medicine

## 2021-09-25 ENCOUNTER — Ambulatory Visit: Payer: Medicaid Other | Admitting: Podiatry

## 2021-09-25 ENCOUNTER — Encounter: Payer: Self-pay | Admitting: Podiatry

## 2021-09-25 DIAGNOSIS — L600 Ingrowing nail: Secondary | ICD-10-CM | POA: Diagnosis not present

## 2021-09-26 ENCOUNTER — Telehealth: Payer: Self-pay

## 2021-09-26 ENCOUNTER — Other Ambulatory Visit: Payer: Self-pay | Admitting: Podiatry

## 2021-09-26 MED ORDER — CICLOPIROX 8 % EX SOLN: Freq: Every day | CUTANEOUS | 0 refills | Status: AC

## 2021-09-26 NOTE — Telephone Encounter (Signed)
Rx sent for Ciclopirox [facility]) 8% topical. Apply daily. Please notify patient. - Dr. Amalia Hailey

## 2021-09-26 NOTE — Telephone Encounter (Signed)
Patient called the office stating her fungus prescription was not sent to the pharmacy and she wanted to know if you can switch the oral medication to a topical ointment.  ? ?Please advise ...  ?

## 2021-09-26 NOTE — Progress Notes (Signed)
PRN fungal nail infection ?

## 2021-09-29 NOTE — Progress Notes (Signed)
? ?  Subjective: ?Patient presents today for evaluation of pain to the lateral border left great toe as well as the medial border right great toe. Patient is concerned for possible ingrown nail.  It is very sensitive to touch.  Patient presents today for further treatment and evaluation. ? ?Past Medical History:  ?Diagnosis Date  ? Allergic reaction   ? Anemia   ? COVID   ? Facial swelling   ? Neuromuscular disorder ([place]) 2015  ? bilateral legs and hands- spasms.   ? ? ?Objective:  ?General: Well developed, nourished, in no acute distress, alert and oriented x3  ? ?Dermatology: Skin is warm, dry and supple bilateral.  Lateral border left great toe medial border right great toe appears to be erythematous with evidence of an ingrowing nail. Pain on palpation noted to the border of the nail fold. The remaining nails appear unremarkable at this time. There are no open sores, lesions. ? ?Vascular: Dorsalis Pedis artery and Posterior Tibial artery pedal pulses palpable. No lower extremity edema noted.  ? ?Neruologic: Grossly intact via light touch bilateral. ? ?Musculoskeletal: Muscular strength within normal limits in all groups bilateral. Normal range of motion noted to all pedal and ankle joints.  ? ?Assesement: ?#1 Paronychia with ingrowing nail LT lat, RT med ?#2 Pain in toe ? ?Plan of Care:  ?1. Patient evaluated.  ?2. Discussed treatment alternatives and plan of care. Explained nail avulsion procedure and post procedure course to patient. ?3. Patient opted for permanent partial nail avulsion of the ingrown portion of the nail.  ?4. Prior to procedure, local anesthesia infiltration utilized using 3 ml of a 50:50 mixture of 2% plain lidocaine and 0.5% plain marcaine in a normal hallux block fashion and a betadine prep performed.  ?5. Partial permanent nail avulsion with chemical matrixectomy performed using [id number] applications of phenol followed by alcohol flush.  ?6. Light dressing applied.  Post care instructions  provided ?7.  Prescription for gentamicin 2% cream  ?8.  Return to clinic 2 weeks. ? ?Edrick Kins, DPM ?[facility] ? ?Dr. Edrick Kins, DPM  ?  ?[address]                                       ?[place], [place] [address]                ?Office [phone]  ?Fax [phone] ? ? ? ? ?

## 2021-10-07 ENCOUNTER — Encounter: Payer: Self-pay | Admitting: Podiatry

## 2021-10-07 ENCOUNTER — Ambulatory Visit: Payer: Medicaid Other | Admitting: Podiatry

## 2021-10-07 DIAGNOSIS — L03031 Cellulitis of right toe: Secondary | ICD-10-CM | POA: Diagnosis not present

## 2021-10-07 MED ORDER — DOXYCYCLINE HYCLATE 100 MG PO TABS: 100.0000 mg | ORAL_TABLET | Freq: Two times a day (BID) | ORAL | 1 refills | Status: AC

## 2021-10-08 NOTE — Progress Notes (Signed)
Subjective:  ? ?Patient ID: Melanie Ortiz, female   DOB: 46 y.o.   MRN: [id number]  ? ?HPI ?Patient is complaining about redness in her toenails that were fixed with Dr. Amalia Hailey procedure in the last 10 days ? ? ?ROS ? ? ?   ?Objective:  ?Physical Exam  ?Patient presents concerned about some redness and drainage of the big toenail left and right with discomfort still present ? ?   ?Assessment:  ?Neurovascular status intact with patient overall doing well with inflammation around the site where the nail surgery was done with low-grade localized redness no proximal edema erythema drainage noted ? ?   ?Plan:  ?Localized low-grade paronychia of the hallux bilateral after having had ingrown toenail removal with no indication of proximal infection and today I went ahead placed on antibiotics doxycycline along with instructions for continued soaks Neosporin usage and reappoint if any issues were to occur ?   ? ? ?

## 2021-10-09 ENCOUNTER — Ambulatory Visit: Payer: Medicaid Other | Admitting: Nurse Practitioner

## 2021-10-09 ENCOUNTER — Encounter: Payer: Self-pay | Admitting: Nurse Practitioner

## 2021-10-09 VITALS — BP 118/76 | HR 70 | Temp 97.6°F | Ht 67.0 in | Wt 166.6 lb

## 2021-10-09 DIAGNOSIS — J301 Allergic rhinitis due to pollen: Secondary | ICD-10-CM

## 2021-10-09 DIAGNOSIS — M62838 Other muscle spasm: Secondary | ICD-10-CM | POA: Diagnosis not present

## 2021-10-09 MED ORDER — CYCLOBENZAPRINE HCL 10 MG PO TABS: 10.0000 mg | ORAL_TABLET | Freq: Three times a day (TID) | ORAL | 0 refills | Status: DC | PRN

## 2021-10-09 MED ORDER — MONTELUKAST SODIUM 10 MG PO TABS: 10.0000 mg | ORAL_TABLET | Freq: Every day | ORAL | 1 refills | Status: AC

## 2021-10-09 NOTE — Progress Notes (Signed)
$'@Patient'T$  ID: Melanie Ortiz, female    DOB: [birth year], 46 y.o.   MRN: [id number] ? ?Chief Complaint  ?Patient presents with  ? Follow-up  ?  Patient is here today for her 6 month follow up visit no physical today and will reschedule. ?Patient has no concerns or issues to discuss today.  ? ? ?Referring provider: ?Vevelyn Francois, NP ? ? ?HPI ? ?Melanie Ortiz presents for follow up. She  has a past medical history of Allergic reaction, Anemia, COVID, Facial swelling, and Neuromuscular disorder ([place]) (2015).  ? ?Patient presents today for follow-up visit.  She states that she needs refills on Singulair which she takes for allergies and a refill on Flexeril which she takes as needed for muscle spasms to her lower extremities.  Overall patient states that she has been doing well.  She is compliant with medications.  She has no new issues or concerns today. ? ?Denies f/c/s, n/v/d, hemoptysis, PND, chest pain or edema. ? ? ? ? ? ?Allergies  ?Allergen Reactions  ? Onion   ? Zanaflex [Tizanidine] Swelling  ? ? ?Immunization History  ?Administered Date(s) Administered  ? Influenza,inj,Quad PF,6+ Mos 07/02/2018, 05/01/2019, 05/03/2020, 04/01/2021  ? Moderna SARS-COV2 Booster Vaccination 01/16/2020  ? Moderna Sars-Covid-2 Vaccination 03/19/2020  ? Tdap 07/02/2018  ? ? ?Past Medical History:  ?Diagnosis Date  ? Allergic reaction   ? Anemia   ? COVID   ? Facial swelling   ? Neuromuscular disorder ([place]) 2015  ? bilateral legs and hands- spasms.   ? ? ?Tobacco History: ?Social History  ? ?Tobacco Use  ?Smoking Status Never  ?Smokeless Tobacco Former  ? Types: Snuff  ? ?Counseling given: Not Answered ? ? ?Outpatient Encounter Medications as of 10/09/2021  ?Medication Sig  ? Capsaicin-Menthol (SALONPAS PAIN REL GEL-PTCH HOT) 0.025-1.25 % PTCH Apply 1 patch topically in the morning, at noon, and at bedtime.  ? cholecalciferol (VITAMIN D3) 25 MCG (1000 UT) tablet Take 1,000 Units by mouth daily.  ? ciclopirox (PENLAC) 8 % solution Apply  topically at bedtime. Apply over nail and surrounding skin. Apply daily over previous coat. After seven (7) days, may remove with alcohol and continue cycle.  ? cyclobenzaprine (FLEXERIL) 10 MG tablet Take 1 tablet (10 mg total) by mouth 3 (three) times daily as needed for muscle spasms.  ? docusate sodium (STOOL SOFTENER) 100 MG capsule Take 1 capsule (100 mg total) by mouth 2 (two) times daily.  ? doxycycline (VIBRA-TABS) 100 MG tablet Take 1 tablet (100 mg total) by mouth 2 (two) times daily.  ? ibuprofen (ADVIL) 800 MG tablet Take 1 tablet (800 mg total) by mouth every 8 (eight) hours as needed for moderate pain. Most last 7 days.  ? [DISCONTINUED] montelukast (SINGULAIR) 10 MG tablet Take 1 tablet (10 mg total) by mouth at bedtime.  ? methylPREDNISolone (MEDROL DOSEPAK) 4 MG TBPK tablet Take by mouth daily. Follow package instructions (Patient not taking: Reported on 10/09/2021)  ? montelukast (SINGULAIR) 10 MG tablet Take 1 tablet (10 mg total) by mouth at bedtime.  ? [DISCONTINUED] tiZANidine (ZANAFLEX) 4 MG tablet Take 1 tablet (4 mg total) by mouth every 8 (eight) hours as needed for muscle spasms. (Patient not taking: Reported on 10/09/2021)  ? ?No facility-administered encounter medications on file as of 10/09/2021.  ? ? ? ?Review of Systems ? ?Review of Systems  ?Constitutional: Negative.   ?HENT: Negative.    ?Cardiovascular: Negative.   ?Gastrointestinal: Negative.   ?Allergic/Immunologic: Negative.   ?  Neurological: Negative.   ?Psychiatric/Behavioral: Negative.     ? ? ? ?Physical Exam ? ?BP 118/76   Pulse 70   Temp 97.6 ?F (36.4 ?C)   Ht '5\' 7"'$  (1.702 m)   Wt 166 lb 9.6 oz (75.6 kg)   SpO2 100%   BMI 26.09 kg/m?  ? ?Wt Readings from Last 5 Encounters:  ?10/09/21 166 lb 9.6 oz (75.6 kg)  ?07/03/21 167 lb 12.8 oz (76.1 kg)  ?04/01/21 165 lb (74.8 kg)  ?10/31/20 162 lb (73.5 kg)  ?07/26/20 168 lb (76.2 kg)  ? ? ? ?Physical Exam ?Vitals and nursing note reviewed.  ?Constitutional:   ?   General: She is  not in acute distress. ?   Appearance: She is well-developed.  ?Cardiovascular:  ?   Rate and Rhythm: Normal rate and regular rhythm.  ?Pulmonary:  ?   Effort: Pulmonary effort is normal.  ?   Breath sounds: Normal breath sounds.  ?Neurological:  ?   Mental Status: She is alert and oriented to person, place, and time.  ? ? ? ?Lab Results: ? ?CBC ?   ?Component Value Date/Time  ? WBC 5.0 10/31/2020 1524  ? RBC 4.23 10/31/2020 1524  ? HGB 11.3 10/31/2020 1524  ? HCT 35.1 10/31/2020 1524  ? PLT 355 10/31/2020 1524  ? MCV 83 10/31/2020 1524  ? MCH 26.7 10/31/2020 1524  ? MCHC 32.2 10/31/2020 1524  ? RDW 13.0 10/31/2020 1524  ? LYMPHSABS 2.3 10/31/2020 1524  ? EOSABS 0.2 10/31/2020 1524  ? BASOSABS 0.1 10/31/2020 1524  ? ? ?BMET ?   ?Component Value Date/Time  ? NA 139 07/03/2021 1551  ? K 4.0 07/03/2021 1551  ? CL 103 07/03/2021 1551  ? CO2 20 07/25/2019 1057  ? GLUCOSE 98 07/03/2021 1551  ? BUN 16 07/03/2021 1551  ? CREATININE 0.77 07/03/2021 1551  ? CALCIUM 9.0 07/03/2021 1551  ? GFRNONAA 93 10/31/2019 1128  ? GFRAA 107 10/31/2019 1128  ? ? ? ?Assessment & Plan:  ? ?Muscle spasms of both lower extremities ?- cyclobenzaprine (FLEXERIL) 10 MG tablet; Take 1 tablet (10 mg total) by mouth 3 (three) times daily as needed for muscle spasms.  Dispense: 30 tablet; Refill: 0 ? ?2. Non-seasonal allergic rhinitis due to pollen ? ?- montelukast (SINGULAIR) 10 MG tablet; Take 1 tablet (10 mg total) by mouth at bedtime.  Dispense: 90 tablet; Refill: 1 ? ? ?Follow up: ? ?Follow up in 6 months or sooner if needed ? ?Patient Instructions  ?1. Muscle spasms of both lower extremities ? ?- cyclobenzaprine (FLEXERIL) 10 MG tablet; Take 1 tablet (10 mg total) by mouth 3 (three) times daily as needed for muscle spasms.  Dispense: 30 tablet; Refill: 0 ? ?2. Non-seasonal allergic rhinitis due to pollen ? ?- montelukast (SINGULAIR) 10 MG tablet; Take 1 tablet (10 mg total) by mouth at bedtime.  Dispense: 90 tablet; Refill: 1 ? ? ?Follow  up: ? ?Follow up in 6 months or sooner if needed ? ? ? ?Muscle Cramps and Spasms ?Muscle cramps and spasms are when muscles tighten by themselves. They usually get better within minutes. Muscle cramps are painful. They are usually stronger and last longer than muscle spasms. Muscle spasms may or may not be painful. They can last a few seconds or much longer. ?Cramps and spasms can affect any muscle, but they occur most often in the calf muscles of the leg. They are usually not caused by a serious problem. In many cases, the cause  is not known. Some common causes include: ?Doing more physical work or exercise than your body is ready for. ?Using the muscles too much (overuse) by repeating certain movements too many times. ?Staying in a certain position for a long time. ?Playing a sport or doing an activity without preparing properly. ?Using bad form or technique while playing a sport or doing an activity. ?Not having enough water in your body (dehydration). ?Injury. ?Side effects of some medicines. ?Low levels of the salts and minerals in your blood (electrolytes), such as low potassium or calcium. ?Follow these instructions at home: ?Managing pain and stiffness ?  ?Massage, stretch, and relax the muscle. Do this for many minutes at a time. ?If told, put heat on tight or tense muscles as often as told by your doctor. Use the heat source that your doctor recommends, such as a moist heat pack or a heating pad. ?Place a towel between your skin and the heat source. ?Leave the heat on for 20-30 minutes. ?Remove the heat if your skin turns bright red. This is very important if you are not able to feel pain, heat, or cold. You may have a greater risk of getting burned. ?If told, put ice on the affected area. This may help if you are sore or have pain after a cramp or spasm. ?Put ice in a plastic bag. ?Place a towel between your skin and the bag. ?Leave the ice on for 20 minutes, 2-3 times a day. ?Try taking hot showers or  baths to help relax tight muscles. ?Eating and drinking ?Drink enough fluid to keep your pee (urine) pale yellow. ?Eat a healthy diet to help ensure that your muscles work well. This should include: ?Fruits and v

## 2021-10-09 NOTE — Patient Instructions (Signed)
1. Muscle spasms of both lower extremities ? ?- cyclobenzaprine (FLEXERIL) 10 MG tablet; Take 1 tablet (10 mg total) by mouth 3 (three) times daily as needed for muscle spasms.  Dispense: 30 tablet; Refill: 0 ? ?2. Non-seasonal allergic rhinitis due to pollen ? ?- montelukast (SINGULAIR) 10 MG tablet; Take 1 tablet (10 mg total) by mouth at bedtime.  Dispense: 90 tablet; Refill: 1 ? ? ?Follow up: ? ?Follow up in 6 months or sooner if needed ? ? ? ?Muscle Cramps and Spasms ?Muscle cramps and spasms are when muscles tighten by themselves. They usually get better within minutes. Muscle cramps are painful. They are usually stronger and last longer than muscle spasms. Muscle spasms may or may not be painful. They can last a few seconds or much longer. ?Cramps and spasms can affect any muscle, but they occur most often in the calf muscles of the leg. They are usually not caused by a serious problem. In many cases, the cause is not known. Some common causes include: ?Doing more physical work or exercise than your body is ready for. ?Using the muscles too much (overuse) by repeating certain movements too many times. ?Staying in a certain position for a long time. ?Playing a sport or doing an activity without preparing properly. ?Using bad form or technique while playing a sport or doing an activity. ?Not having enough water in your body (dehydration). ?Injury. ?Side effects of some medicines. ?Low levels of the salts and minerals in your blood (electrolytes), such as low potassium or calcium. ?Follow these instructions at home: ?Managing pain and stiffness ?  ?Massage, stretch, and relax the muscle. Do this for many minutes at a time. ?If told, put heat on tight or tense muscles as often as told by your doctor. Use the heat source that your doctor recommends, such as a moist heat pack or a heating pad. ?Place a towel between your skin and the heat source. ?Leave the heat on for 20-30 minutes. ?Remove the heat if your skin  turns bright red. This is very important if you are not able to feel pain, heat, or cold. You may have a greater risk of getting burned. ?If told, put ice on the affected area. This may help if you are sore or have pain after a cramp or spasm. ?Put ice in a plastic bag. ?Place a towel between your skin and the bag. ?Leave the ice on for 20 minutes, 2-3 times a day. ?Try taking hot showers or baths to help relax tight muscles. ?Eating and drinking ?Drink enough fluid to keep your pee (urine) pale yellow. ?Eat a healthy diet to help ensure that your muscles work well. This should include: ?Fruits and vegetables. ?Lean protein. ?Whole grains. ?Low-fat or nonfat dairy products. ?General instructions ?If you are having cramps often, avoid intense exercise for several days. ?Take over-the-counter and prescription medicines only as told by your doctor. ?Watch for any changes in your symptoms. ?Keep all follow-up visits as told by your doctor. This is important. ?Contact a doctor if: ?Your cramps or spasms get worse or happen more often. ?Your cramps or spasms do not get better with time. ?Summary ?Muscle cramps and spasms are when muscles tighten by themselves. They usually get better within minutes. ?Cramps and spasms occur most often in the calf muscles of the leg. ?Massage, stretch, and relax the muscle. This may help the cramp or spasm go away. ?Drink enough fluid to keep your pee (urine) pale yellow. ?This information is  not intended to replace advice given to you by your health care provider. Make sure you discuss any questions you have with your health care provider. ?Document Revised: 01/04/2021 Document Reviewed: 01/04/2021 ?Elsevier Patient Education ? 2022 [place]. ? ? ?

## 2021-10-11 ENCOUNTER — Encounter: Payer: Self-pay | Admitting: Nurse Practitioner

## 2021-10-11 DIAGNOSIS — M62838 Other muscle spasm: Secondary | ICD-10-CM | POA: Insufficient documentation

## 2021-10-11 NOTE — Assessment & Plan Note (Signed)
-   cyclobenzaprine (FLEXERIL) 10 MG tablet; Take 1 tablet (10 mg total) by mouth 3 (three) times daily as needed for muscle spasms.  Dispense: 30 tablet; Refill: 0 ? ?2. Non-seasonal allergic rhinitis due to pollen ? ?- montelukast (SINGULAIR) 10 MG tablet; Take 1 tablet (10 mg total) by mouth at bedtime.  Dispense: 90 tablet; Refill: 1 ? ? ?Follow up: ? ?Follow up in 6 months or sooner if needed ?

## 2021-10-21 ENCOUNTER — Ambulatory Visit: Payer: Medicaid Other | Admitting: Podiatry

## 2021-10-21 DIAGNOSIS — L03031 Cellulitis of right toe: Secondary | ICD-10-CM | POA: Diagnosis not present

## 2021-10-21 NOTE — Progress Notes (Signed)
? ?  Subjective: ?46 y.o. female presents today status post permanent nail avulsion procedure of the lateral border left great toe, medial border right great toe that was performed on 09/25/2021.  Patient states that she is doing well.  She says she is feeling much better.  She did her soaking instructions and applying antibiotic cream as instructed.  She was also was prescribed oral antibiotics when she came into the office for follow-up on 10/07/2021..  ? ?Past Medical History:  ?Diagnosis Date  ? Allergic reaction   ? Anemia   ? COVID   ? Facial swelling   ? Neuromuscular disorder ([place]) 2015  ? bilateral legs and hands- spasms.   ? ? ?Objective: ?Skin is warm, dry and supple. Nail and respective nail fold appears to be healing appropriately.  No open wound.  No drainage.  Well-healing nail matricectomy site ? ?Assessment: ?#1 s/p partial permanent nail matrixectomy LT lateral, RT medial ? ? ?Plan of care: ?#1 patient was evaluated  ?#2 light debridement of open wound was performed to the periungual border of the respective toe using a currette. Antibiotic ointment and Band-Aid was applied. ?#3 patient is to return to clinic on a PRN basis. ? ? ?Edrick Kins, DPM ?[facility] ? ?Dr. Edrick Kins, DPM  ?  ?[address]                                      ?[place], [place] [address]                ?Office [phone]  ?Fax [phone] ? ? ? ? ?

## 2021-11-12 ENCOUNTER — Ambulatory Visit: Payer: Medicaid Other | Admitting: Nurse Practitioner

## 2021-11-12 ENCOUNTER — Encounter: Payer: Self-pay | Admitting: Nurse Practitioner

## 2021-11-12 VITALS — BP 119/76 | HR 63 | Temp 98.2°F | Ht 67.0 in | Wt 168.2 lb

## 2021-11-12 DIAGNOSIS — G8929 Other chronic pain: Secondary | ICD-10-CM | POA: Diagnosis not present

## 2021-11-12 DIAGNOSIS — M545 Low back pain, unspecified: Secondary | ICD-10-CM

## 2021-11-12 MED ORDER — NAPROXEN 500 MG PO TABS: 500.0000 mg | ORAL_TABLET | Freq: Two times a day (BID) | ORAL | 0 refills | Status: AC

## 2021-11-12 MED ORDER — KETOROLAC TROMETHAMINE 30 MG/ML IJ SOLN
15.0000 mg | Freq: Once | INTRAMUSCULAR | Status: AC
  Administered 2021-11-12: 15 mg via INTRAMUSCULAR

## 2021-11-12 MED ORDER — METHOCARBAMOL 750 MG PO TABS: 750.0000 mg | ORAL_TABLET | Freq: Four times a day (QID) | ORAL | 0 refills | Status: AC | PRN

## 2021-11-12 MED ORDER — LIDOCAINE 4 % EX PTCH: 1.0000 | MEDICATED_PATCH | CUTANEOUS | 0 refills | Status: AC

## 2021-11-12 NOTE — Progress Notes (Signed)
[id number] ?

## 2021-11-12 NOTE — Patient Instructions (Signed)
You were seen today in the [facility] for low back pain. You were prescribed medications, please take as directed. Please follow up in 3 wks as needed. ? ?Please do not take Flexeril and Robaxin together. Your flexeril has been discontinued. Please do not take Naproxen and ibuprofen together, you can alternate naproxen with Tylenol as needed for pain. ?

## 2021-11-12 NOTE — Progress Notes (Signed)
? ?[place] ?[place], [place]  [address] ?Phone:  [phone]   Fax:  [phone] ?Subjective:  ? Patient ID: Melanie Ortiz, female    DOB: 01/14/1976, 46 y.o.   MRN: [id number] ? ?Chief Complaint  ?Patient presents with  ? Back Pain  ?  Pt stated she has left side lower back pain pain radiating down left leg. Pt stated it has been 1 week with this pain ibuprofen not working.   ? ?HPI ?Melanie Ortiz 46 y.o. female  has a past medical history of Allergic reaction, Anemia, Asthma, COVID, Facial swelling, and Neuromuscular disorder ([place]) (2015). To the [facility] for back pain. ? ?Patient states that she has had back pain for several years, with acute flare over the past week. States that she has had some improvement in pain with ibuprofen and flexeril, but has not had complete resolution of symptoms. Pain is located in the lower back, rates 8/10. Denies any recent trauma or injury. Unknown cause for flare, wakes up with stiffness and pain in the lower back radiating down LLE. Has been unable to work today due to pain, currently works as a Sports coach. Denies any other concerns today.  ? ?Denies any fatigue, chest pain, shortness of breath, HA or dizziness. Denies any blurred vision, numbness or tingling. ? ? ?Past Medical History:  ?Diagnosis Date  ? Allergic reaction   ? Anemia   ? Asthma   ? COVID   ? Facial swelling   ? Neuromuscular disorder ([place]) 2015  ? bilateral legs and hands- spasms.   ? ? ?Past Surgical History:  ?Procedure Laterality Date  ? ABDOMINAL HYSTERECTOMY    ? 04/25/2015  ? CHOLECYSTECTOMY    ? ? ?Family History  ?Problem Relation Age of Onset  ? Cancer Mother   ?     cervical   ? GER disease Mother   ? Diabetes Brother   ? Ovarian cysts Daughter   ? ADD / ADHD Son   ? Breast cancer Cousin   ? ? ?Social History  ? ?Socioeconomic History  ? Marital status: Single  ?  Spouse name: Not on file  ? Number of children: 3  ? Years of education: Not on file  ? Highest  education level: 8th grade  ?Occupational History  ? Not on file  ?Tobacco Use  ? Smoking status: Never  ? Smokeless tobacco: Former  ?  Types: Snuff  ?Vaping Use  ? Vaping Use: Never used  ?Substance and Sexual Activity  ? Alcohol use: Not Currently  ?  Alcohol/week: 3.0 standard drinks  ?  Types: 3 Cans of beer per week  ?  Comment: everyday   ? Drug use: Never  ? Sexual activity: Yes  ?  Birth control/protection: Post-menopausal  ?Other Topics Concern  ? Not on file  ?Social History Narrative  ? Not on file  ? ?Social Determinants of Health  ? ?Financial Resource Strain: Not on file  ?Food Insecurity: Not on file  ?Transportation Needs: Not on file  ?Physical Activity: Not on file  ?Stress: Not on file  ?Social Connections: Not on file  ?Intimate Partner Violence: Not on file  ? ? ?Outpatient Medications Prior to Visit  ?Medication Sig Dispense Refill  ? cholecalciferol (VITAMIN D3) 25 MCG (1000 UT) tablet Take 1,000 Units by mouth daily.    ? docusate sodium (STOOL SOFTENER) 100 MG capsule Take 1 capsule (100 mg total) by mouth 2 (two) times  daily. 10 capsule 0  ? ibuprofen (ADVIL) 800 MG tablet Take 1 tablet (800 mg total) by mouth every 8 (eight) hours as needed for moderate pain. Most last 7 days. 90 tablet 1  ? montelukast (SINGULAIR) 10 MG tablet Take 1 tablet (10 mg total) by mouth at bedtime. 90 tablet 1  ? cyclobenzaprine (FLEXERIL) 10 MG tablet Take 1 tablet (10 mg total) by mouth 3 (three) times daily as needed for muscle spasms. 30 tablet 0  ? ciclopirox (PENLAC) 8 % solution Apply topically at bedtime. Apply over nail and surrounding skin. Apply daily over previous coat. After seven (7) days, may remove with alcohol and continue cycle. (Patient not taking: Reported on 11/12/2021) 6.6 mL 0  ? doxycycline (VIBRA-TABS) 100 MG tablet Take 1 tablet (100 mg total) by mouth 2 (two) times daily. (Patient not taking: Reported on 11/12/2021) 20 tablet 1  ? methylPREDNISolone (MEDROL DOSEPAK) 4 MG TBPK tablet  Take by mouth daily. Follow package instructions (Patient not taking: Reported on 10/09/2021) 21 tablet 0  ? ?No facility-administered medications prior to visit.  ? ? ?Allergies  ?Allergen Reactions  ? Onion Swelling  ? Zanaflex [Tizanidine]   ?  Dizziness, confusion  ? ? ?Review of Systems  ?Constitutional: Negative.  Negative for chills, fever and malaise/fatigue.  ?Respiratory:  Negative for cough and shortness of breath.   ?Cardiovascular:  Negative for chest pain, palpitations and leg swelling.  ?Gastrointestinal:  Negative for abdominal pain, blood in stool, constipation, diarrhea, nausea and vomiting.  ?Musculoskeletal:  Positive for back pain.  ?Skin: Negative.   ?Neurological: Negative.   ?Psychiatric/Behavioral:  Negative for depression. The patient is not nervous/anxious.   ?All other systems reviewed and are negative. ? ?   ?Objective:  ?  ?Physical Exam ?Constitutional:   ?   General: She is not in acute distress. ?   Appearance: Normal appearance. She is normal weight.  ?HENT:  ?   Head: Normocephalic.  ?Cardiovascular:  ?   Rate and Rhythm: Normal rate and regular rhythm.  ?   Pulses: Normal pulses.  ?   Heart sounds: Normal heart sounds.  ?   Comments: No obvious peripheral edema ?Pulmonary:  ?   Effort: Pulmonary effort is normal.  ?   Breath sounds: Normal breath sounds.  ?Musculoskeletal:     ?   General: Tenderness present. No swelling, deformity or signs of injury. Normal range of motion.  ?   Right lower leg: No edema.  ?   Left lower leg: No edema.  ?   Comments: Moderate tenderness with palpation of diffuse lower lumbar   ?Skin: ?   General: Skin is warm and dry.  ?   Capillary Refill: Capillary refill takes less than 2 seconds.  ?Neurological:  ?   General: No focal deficit present.  ?   Mental Status: She is alert and oriented to person, place, and time.  ?Psychiatric:     ?   Mood and Affect: Mood normal.     ?   Behavior: Behavior normal.     ?   Thought Content: Thought content normal.      ?   Judgment: Judgment normal.  ? ? ?BP 119/76 (BP Location: Left Arm, Patient Position: Sitting, Cuff Size: Normal)   Pulse 63   Temp 98.2 ?F (36.8 ?C)   Ht '5\' 7"'  (1.702 m)   Wt 168 lb 3.2 oz (76.3 kg)   SpO2 100%   BMI 26.34 kg/m?  ?Wt  Readings from Last 3 Encounters:  ?11/12/21 168 lb 3.2 oz (76.3 kg)  ?10/09/21 166 lb 9.6 oz (75.6 kg)  ?07/03/21 167 lb 12.8 oz (76.1 kg)  ? ? ?Immunization History  ?Administered Date(s) Administered  ? Influenza,inj,Quad PF,6+ Mos 07/02/2018, 05/01/2019, 05/03/2020, 04/01/2021  ? Moderna SARS-COV2 Booster Vaccination 01/16/2020  ? Moderna Sars-Covid-2 Vaccination 03/19/2020  ? Tdap 07/02/2018  ? ? ?Diabetic Foot Exam - Simple   ?No data filed ?  ? ? ?No results found for: TSH ?Lab Results  ?Component Value Date  ? WBC 5.0 10/31/2020  ? HGB 11.3 10/31/2020  ? HCT 35.1 10/31/2020  ? MCV 83 10/31/2020  ? PLT 355 10/31/2020  ? ?Lab Results  ?Component Value Date  ? NA 139 07/03/2021  ? K 4.0 07/03/2021  ? CO2 20 07/25/2019  ? GLUCOSE 98 07/03/2021  ? BUN 16 07/03/2021  ? CREATININE 0.77 07/03/2021  ? BILITOT <0.2 07/03/2021  ? ALKPHOS 46 07/03/2021  ? AST 13 07/03/2021  ? ALT 9 07/25/2019  ? PROT 6.8 07/03/2021  ? ALBUMIN 4.6 07/03/2021  ? CALCIUM 9.0 07/03/2021  ? EGFR 96 07/03/2021  ? ?Lab Results  ?Component Value Date  ? CHOL 205 (H) 07/03/2021  ? CHOL 231 (H) 10/31/2019  ? CHOL 204 (H) 07/25/2019  ? ?Lab Results  ?Component Value Date  ? HDL 92 07/03/2021  ? HDL 103 10/31/2019  ? HDL 100 07/25/2019  ? ?Lab Results  ?Component Value Date  ? [place] 89 07/03/2021  ? LDLCALC 121 (H) 10/31/2019  ? [place] 93 07/25/2019  ? ?Lab Results  ?Component Value Date  ? TRIG 146 07/03/2021  ? TRIG 44 10/31/2019  ? TRIG 61 07/25/2019  ? ?Lab Results  ?Component Value Date  ? CHOLHDL 2.2 07/03/2021  ? CHOLHDL 2.2 10/31/2019  ? CHOLHDL 2.0 07/25/2019  ? ?No results found for: HGBA1C ? ?   ?Assessment & Plan:  ? ?Problem List Items Addressed This Visit   ?None ?Visit Diagnoses   ? ?  Chronic low back pain without sciatica, unspecified back pain laterality    -  Primary  ? Relevant Medications  ? ketorolac (TORADOL) 30 MG/ML injection 15 mg (Completed), injection completed during visit  ? naproxe

## 2021-11-20 ENCOUNTER — Encounter: Payer: Self-pay | Admitting: Nurse Practitioner

## 2021-11-20 ENCOUNTER — Ambulatory Visit: Payer: Medicaid Other | Admitting: Nurse Practitioner

## 2021-11-20 ENCOUNTER — Ambulatory Visit (HOSPITAL_COMMUNITY)
Admission: RE | Admit: 2021-11-20 | Discharge: 2021-11-20 | Disposition: A | Discharge status: Home / Self Care | Payer: Medicaid Other | Source: Ambulatory Visit | Attending: Nurse Practitioner | Admitting: Nurse Practitioner

## 2021-11-20 VITALS — BP 114/71 | HR 87 | Temp 97.4°F | Ht 67.0 in | Wt 178.0 lb

## 2021-11-20 DIAGNOSIS — G8929 Other chronic pain: Secondary | ICD-10-CM | POA: Insufficient documentation

## 2021-11-20 DIAGNOSIS — M25552 Pain in left hip: Secondary | ICD-10-CM | POA: Diagnosis not present

## 2021-11-20 MED ORDER — PREDNISONE 20 MG PO TABS: 20.0000 mg | ORAL_TABLET | Freq: Every day | ORAL | 0 refills | Status: AC

## 2021-11-20 MED ORDER — METHYLPREDNISOLONE SODIUM SUCC 40 MG IJ SOLR: 40.0000 mg | Freq: Once | INTRAMUSCULAR | Status: DC

## 2021-11-20 MED ORDER — METHYLPREDNISOLONE SODIUM SUCC 40 MG IJ SOLR
40.0000 mg | Freq: Once | INTRAMUSCULAR | Status: AC
  Administered 2021-11-20: 40 mg via INTRAMUSCULAR

## 2021-11-20 NOTE — Addendum Note (Signed)
Addended by: Blair Heys L on: 11/20/2021 12:05 PM   Modules accepted: Orders

## 2021-11-20 NOTE — Progress Notes (Signed)
$'@Patient't$  ID: Melanie Ortiz, female    DOB: 1975-08-14, 46 y.o.   MRN: [id number]  Chief Complaint  Patient presents with   Leg Pain    Pt stated she still has pain in her left hip down to her legs. Pt stated left feet is numb also pt is requesting a Dr note for work    Referring provider: Fenton Foy, NP   HPI  Melanie Ortiz presents for follow up. She  has a past medical history of Allergic reaction, Anemia, COVID, Facial swelling, and Neuromuscular disorder ([place]) (2015).    Patient presents today for left hip pain. This has been an issue for 4 weeks. Patient is currently taking methocarbamol and naproxen.    Denies f/c/s, n/v/d, hemoptysis, PND, chest pain or edema.     Allergies  Allergen Reactions   Onion Swelling   Zanaflex [Tizanidine]     Dizziness, confusion    Immunization History  Administered Date(s) Administered   Influenza,inj,Quad PF,6+ Mos 07/02/2018, 05/01/2019, 05/03/2020, 04/01/2021   Moderna SARS-COV2 Booster Vaccination 01/16/2020   Moderna Sars-Covid-2 Vaccination 03/19/2020   Tdap 07/02/2018    Past Medical History:  Diagnosis Date   Allergic reaction    Anemia    Asthma    COVID    Facial swelling    Neuromuscular disorder ([place]) 2015   bilateral legs and hands- spasms.     Tobacco History: Social History   Tobacco Use  Smoking Status Never  Smokeless Tobacco Former   Types: Snuff   Counseling given: Not Answered   Outpatient Encounter Medications as of 11/20/2021  Medication Sig   cholecalciferol (VITAMIN D3) 25 MCG (1000 UT) tablet Take 1,000 Units by mouth daily.   docusate sodium (STOOL SOFTENER) 100 MG capsule Take 1 capsule (100 mg total) by mouth 2 (two) times daily.   ibuprofen (ADVIL) 800 MG tablet Take 1 tablet (800 mg total) by mouth every 8 (eight) hours as needed for moderate pain. Most last 7 days.   lidocaine (HM LIDOCAINE PATCH) 4 % Place 1 patch onto the skin daily.   methocarbamol (ROBAXIN-750) 750 MG tablet  Take 1 tablet (750 mg total) by mouth every 6 (six) hours as needed for muscle spasms.   montelukast (SINGULAIR) 10 MG tablet Take 1 tablet (10 mg total) by mouth at bedtime.   naproxen (NAPROSYN) 500 MG tablet Take 1 tablet (500 mg total) by mouth 2 (two) times daily with a meal for 14 days.   predniSONE (DELTASONE) 20 MG tablet Take 1 tablet (20 mg total) by mouth daily with breakfast for 5 days.   ciclopirox (PENLAC) 8 % solution Apply topically at bedtime. Apply over nail and surrounding skin. Apply daily over previous coat. After seven (7) days, may remove with alcohol and continue cycle. (Patient not taking: Reported on 11/12/2021)   doxycycline (VIBRA-TABS) 100 MG tablet Take 1 tablet (100 mg total) by mouth 2 (two) times daily. (Patient not taking: Reported on 11/12/2021)   [DISCONTINUED] methylPREDNISolone (MEDROL DOSEPAK) 4 MG TBPK tablet Take by mouth daily. Follow package instructions (Patient not taking: Reported on 10/09/2021)   Facility-Administered Encounter Medications as of 11/20/2021  Medication   methylPREDNISolone sodium succinate (SOLU-MEDROL) 40 mg/mL injection 40 mg     Review of Systems  Review of Systems  Constitutional: Negative.   HENT: Negative.    Cardiovascular: Negative.   Gastrointestinal: Negative.   Musculoskeletal:        Left hip pain.   Allergic/Immunologic: Negative.  Neurological: Negative.   Psychiatric/Behavioral: Negative.        Physical Exam  BP 114/71 (BP Location: Left Arm, Patient Position: Sitting, Cuff Size: Normal)   Pulse 87   Temp (!) 97.4 F (36.3 C)   Ht '5\' 7"'$  (1.702 m)   Wt 178 lb (80.7 kg)   SpO2 100%   BMI 27.88 kg/m   Wt Readings from Last 5 Encounters:  11/20/21 178 lb (80.7 kg)  11/12/21 168 lb 3.2 oz (76.3 kg)  10/09/21 166 lb 9.6 oz (75.6 kg)  07/03/21 167 lb 12.8 oz (76.1 kg)  04/01/21 165 lb (74.8 kg)     Physical Exam Vitals and nursing note reviewed.  Constitutional:      General: She is not in acute  distress.    Appearance: She is well-developed.  Cardiovascular:     Rate and Rhythm: Normal rate and regular rhythm.  Pulmonary:     Effort: Pulmonary effort is normal.     Breath sounds: Normal breath sounds.  Musculoskeletal:     Left hip: Tenderness present. Normal range of motion.       Legs:  Neurological:     Mental Status: She is alert and oriented to person, place, and time.     Lab Results:  CBC    Component Value Date/Time   WBC 5.0 10/31/2020 1524   RBC 4.23 10/31/2020 1524   HGB 11.3 10/31/2020 1524   HCT 35.1 10/31/2020 1524   PLT 355 10/31/2020 1524   MCV 83 10/31/2020 1524   MCH 26.7 10/31/2020 1524   MCHC 32.2 10/31/2020 1524   RDW 13.0 10/31/2020 1524   LYMPHSABS 2.3 10/31/2020 1524   EOSABS 0.2 10/31/2020 1524   BASOSABS 0.1 10/31/2020 1524    BMET    Component Value Date/Time   NA 139 07/03/2021 1551   K 4.0 07/03/2021 1551   CL 103 07/03/2021 1551   CO2 20 07/25/2019 1057   GLUCOSE 98 07/03/2021 1551   BUN 16 07/03/2021 1551   CREATININE 0.77 07/03/2021 1551   CALCIUM 9.0 07/03/2021 1551   GFRNONAA 93 10/31/2019 1128   GFRAA 107 10/31/2019 1128    BNP No results found for: BNP  ProBNP No results found for: PROBNP  Imaging: No results found.   Assessment & Plan:   Chronic left hip pain - AMB referral to orthopedics - DG Hip Unilat W OR W/O Pelvis 2-3 Views Left - predniSONE (DELTASONE) 20 MG tablet; Take 1 tablet (20 mg total) by mouth daily with breakfast for 5 days.  Dispense: 5 tablet; Refill: 0  Will give steroid shot today - may start prednisone tomorrow  Follow up:  Follow up if needed     Fenton Foy, NP 11/20/2021

## 2021-11-20 NOTE — Patient Instructions (Addendum)
1. Chronic left hip pain  - AMB referral to orthopedics - DG Hip Unilat W OR W/O Pelvis 2-3 Views Left - predniSONE (DELTASONE) 20 MG tablet; Take 1 tablet (20 mg total) by mouth daily with breakfast for 5 days.  Dispense: 5 tablet; Refill: 0  Will give steroid shot today - may start prednisone tomorrow  Follow up:  Follow up if needed    Hip Pain The hip is the joint between the upper legs and the lower pelvis. The bones, cartilage, tendons, and muscles of your hip joint support your body and allow you to move around. Hip pain can range from a minor ache to severe pain in one or both of your hips. The pain may be felt on the inside of the hip joint near the groin, or on the outside near the buttocks and upper thigh. You may also have swelling or stiffness in your hip area. Follow these instructions at home: Managing pain, stiffness, and swelling     If directed, put ice on the painful area. To do this: Put ice in a plastic bag. Place a towel between your skin and the bag. Leave the ice on for 20 minutes, 2-3 times a day. If directed, apply heat to the affected area as often as told by your health care provider. Use the heat source that your health care provider recommends, such as a moist heat pack or a heating pad. Place a towel between your skin and the heat source. Leave the heat on for 20-30 minutes. Remove the heat if your skin turns bright red. This is especially important if you are unable to feel pain, heat, or cold. You may have a greater risk of getting burned. Activity Do exercises as told by your health care provider. Avoid activities that cause pain. General instructions  Take over-the-counter and prescription medicines only as told by your health care provider. Keep a journal of your symptoms. Write down: How often you have hip pain. The location of your pain. What the pain feels like. What makes the pain worse. Sleep with a pillow between your legs on your  most comfortable side. Keep all follow-up visits as told by your health care provider. This is important. Contact a health care provider if: You cannot put weight on your leg. Your pain or swelling continues or gets worse after one week. It gets harder to walk. You have a fever. Get help right away if: You fall. You have a sudden increase in pain and swelling in your hip. Your hip is red or swollen or very tender to touch. Summary Hip pain can range from a minor ache to severe pain in one or both of your hips. The pain may be felt on the inside of the hip joint near the groin, or on the outside near the buttocks and upper thigh. Avoid activities that cause pain. Write down how often you have hip pain, the location of the pain, what makes it worse, and what it feels like. This information is not intended to replace advice given to you by your health care provider. Make sure you discuss any questions you have with your health care provider. Document Revised: 11/01/2018 Document Reviewed: 11/01/2018 Elsevier Patient Education  [place].

## 2021-11-20 NOTE — Assessment & Plan Note (Signed)
-   AMB referral to orthopedics - DG Hip Unilat W OR W/O Pelvis 2-3 Views Left - predniSONE (DELTASONE) 20 MG tablet; Take 1 tablet (20 mg total) by mouth daily with breakfast for 5 days.  Dispense: 5 tablet; Refill: 0  Will give steroid shot today - may start prednisone tomorrow  Follow up:  Follow up if needed

## 2021-11-20 NOTE — Addendum Note (Signed)
Addended by: Blair Heys L on: 11/20/2021 11:56 AM   Modules accepted: Orders

## 2021-11-26 ENCOUNTER — Encounter: Payer: Self-pay | Admitting: Orthopaedic Surgery

## 2021-11-26 ENCOUNTER — Ambulatory Visit: Payer: Medicaid Other | Admitting: Orthopaedic Surgery

## 2021-11-26 ENCOUNTER — Ambulatory Visit (INDEPENDENT_AMBULATORY_CARE_PROVIDER_SITE_OTHER): Payer: Medicaid Other

## 2021-11-26 DIAGNOSIS — M5442 Lumbago with sciatica, left side: Secondary | ICD-10-CM

## 2021-11-26 MED ORDER — PREDNISONE 10 MG (21) PO TBPK: ORAL_TABLET | ORAL | 0 refills | Status: AC

## 2021-11-26 NOTE — Progress Notes (Signed)
Office Visit Note   Patient: Melanie Ortiz           Date of Birth: [birth year]           MRN: [id number] Visit Date: 11/26/2021              Requested by: Fenton Foy, NP [phone] [address] [place],  [place] [address] PCP: Fenton Foy, NP   Assessment & Plan: Visit Diagnoses:  1. Acute left-sided low back pain with left-sided sciatica     Plan: Impression is chronic left-sided low back pain left lower extremity radiculopathy.  This point, would like to start the patient on the steroid taper.  She has been instructed to stop the steroid she is currently taking.  She will continue with the muscle relaxer as needed.  If her symptoms do not improve over the next few weeks she will let me know we will get an MRI of the lumbar spine as she is already failed physical therapy.  Call with concerns or questions in the meantime.  Follow-Up Instructions: Return if symptoms worsen or fail to improve.   Orders:  Orders Placed This Encounter  Procedures   XR Lumbar Spine 2-3 Views   Meds ordered this encounter  Medications   predniSONE (STERAPRED UNI-PAK 21 TAB) 10 MG (21) TBPK tablet    Sig: Take as directed    Dispense:  21 tablet    Refill:  0      Procedures: No procedures performed   Clinical Data: No additional findings.   Subjective: Chief Complaint  Patient presents with   Left Foot - Numbness    HPI patient is a very pleasant 46 year old female who comes in today with left lower back pain and left lateral hip pain for the past 2 to 3 weeks.  She denies any injury or change in activity.  Pain she has starts in the left lower back radiates to the buttock and into the lateral hip.  She denies any pain down the leg but notes that she has been getting numbness and tingling into the foot.  She has associated cramping at times for which she takes muscle relaxer.  She was recently started on a steroid taper a few days ago but has not noticed much relief.  No bowel or  bladder change or saddle paresthesias.  She has been to physical therapy in the past which has not helped.  No previous ESI.  Review of Systems as detailed in HPI.  All others reviewed and are negative.   Objective: Vital Signs: There were no vitals taken for this visit.  Physical Exam well-developed well-nourished female no acute distress.  Alert and oriented x3.  Ortho Exam lumbar spine exam shows no spinous tenderness.  She has slight tenderness to the left paraspinous musculature.  No pain with lumbar flexion.  She does have pain with lumbar extension.  Positive straight leg raise on the left.  No focal weakness.  Decree sensation to plantar aspect of the left foot.  Specialty Comments:  No specialty comments available.  Imaging: XR Lumbar Spine 2-3 Views  Result Date: 11/26/2021 X-rays demonstrate moderate degenerative changes L5-S1    PMFS History: Patient Active Problem List   Diagnosis Date Noted   Chronic left hip pain 11/20/2021   Muscle spasms of both lower extremities 10/11/2021   Uterine leiomyoma 07/25/2019   Menorrhagia 07/25/2019   Dysmenorrhea 07/25/2019   Anemia 07/25/2019   Facial swelling 08/25/2018  Allergic reaction 08/25/2018   Bacterial vaginosis 05/17/2015   Past Medical History:  Diagnosis Date   Allergic reaction    Anemia    Asthma    COVID    Facial swelling    Neuromuscular disorder ([place]) 2015   bilateral legs and hands- spasms.     Family History  Problem Relation Age of Onset   Cancer Mother        cervical    GER disease Mother    Diabetes Brother    Ovarian cysts Daughter    ADD / ADHD Son    Breast cancer Cousin     Past Surgical History:  Procedure Laterality Date   ABDOMINAL HYSTERECTOMY     04/25/2015   CHOLECYSTECTOMY     Social History   Occupational History   Not on file  Tobacco Use   Smoking status: Never   Smokeless tobacco: Former    Types: Snuff  Vaping Use   Vaping Use: Never used  Substance and  Sexual Activity   Alcohol use: Not Currently    Alcohol/week: 3.0 standard drinks    Types: 3 Cans of beer per week    Comment: everyday    Drug use: Never   Sexual activity: Yes    Birth control/protection: Post-menopausal

## 2022-03-18 ENCOUNTER — Other Ambulatory Visit: Payer: Self-pay | Admitting: General Surgery

## 2022-03-18 DIAGNOSIS — R928 Other abnormal and inconclusive findings on diagnostic imaging of breast: Secondary | ICD-10-CM

## 2022-04-10 ENCOUNTER — Ambulatory Visit (INDEPENDENT_AMBULATORY_CARE_PROVIDER_SITE_OTHER): Payer: Commercial Managed Care - HMO | Admitting: Nurse Practitioner

## 2022-04-10 ENCOUNTER — Encounter: Payer: Self-pay | Admitting: Nurse Practitioner

## 2022-04-10 VITALS — BP 125/76 | HR 74 | Temp 98.1°F | Wt 171.6 lb

## 2022-04-10 DIAGNOSIS — M25512 Pain in left shoulder: Secondary | ICD-10-CM

## 2022-04-10 DIAGNOSIS — M419 Scoliosis, unspecified: Secondary | ICD-10-CM | POA: Diagnosis not present

## 2022-04-10 DIAGNOSIS — Z23 Encounter for immunization: Secondary | ICD-10-CM | POA: Diagnosis not present

## 2022-04-10 DIAGNOSIS — Z1231 Encounter for screening mammogram for malignant neoplasm of breast: Secondary | ICD-10-CM | POA: Diagnosis not present

## 2022-04-10 DIAGNOSIS — Z1211 Encounter for screening for malignant neoplasm of colon: Secondary | ICD-10-CM

## 2022-04-10 DIAGNOSIS — Z0000 Encounter for general adult medical examination without abnormal findings: Secondary | ICD-10-CM | POA: Diagnosis not present

## 2022-04-10 MED ORDER — PREDNISONE 10 MG PO TABS: ORAL_TABLET | ORAL | 0 refills | Status: AC

## 2022-04-10 MED ORDER — CYCLOBENZAPRINE HCL 10 MG PO TABS: 10.0000 mg | ORAL_TABLET | Freq: Three times a day (TID) | ORAL | 5 refills | Status: DC | PRN

## 2022-04-10 MED ORDER — IBUPROFEN 800 MG PO TABS: 800.0000 mg | ORAL_TABLET | Freq: Three times a day (TID) | ORAL | 1 refills | Status: DC | PRN

## 2022-04-10 NOTE — Progress Notes (Signed)
Left shoulder pain and referral for MM.

## 2022-04-10 NOTE — Patient Instructions (Addendum)
1. Scoliosis of lumbosacral spine, unspecified scoliosis type  - ibuprofen (ADVIL) 800 MG tablet; Take 1 tablet (800 mg total) by mouth every 8 (eight) hours as needed for moderate pain. Most last 7 days.  Dispense: 90 tablet; Refill: 1 - cyclobenzaprine (FLEXERIL) 10 MG tablet; Take 1 tablet (10 mg total) by mouth 3 (three) times daily as needed for muscle spasms.  Dispense: 30 tablet; Refill: 5  2. Encounter for screening mammogram for malignant neoplasm of breast   3. Acute pain of left shoulder  - predniSONE (DELTASONE) 10 MG tablet; Take 4 tabs for 2 days, then 3 tabs for 2 days, then 2 tabs for 2 days, then 1 tab for 2 days, then stop  Dispense: 20 tablet; Refill: 0  4. Routine health maintenance  - CBC - Comprehensive metabolic panel  5. Colon cancer screening  - Cologuard  6. Need for immunization against influenza  - Flu Vaccine QUAD 24mo+IM (Fluarix, Fluzone & Alfiuria Quad PF)   Follow up:  Follow up in 6 months or sooner if needed

## 2022-04-10 NOTE — Progress Notes (Signed)
@Patient  ID: Melanie Ortiz, female    DOB: 1976-06-02, 46 y.o.   MRN: [id number]  Chief Complaint  Patient presents with   Shoulder Pain    Referring provider: Fenton Foy, NP   HPI  Patient presents today with shoulder pain.  She states has been hurting x 1 week.  She has been doing heavy lifting at work.  She has limited range of motion and cannot reach behind her back. Denies f/c/s, n/v/d, hemoptysis, PND, leg swelling Denies chest pain or edema     Allergies  Allergen Reactions   Onion Swelling   Zanaflex [Tizanidine]     Dizziness, confusion    Immunization History  Administered Date(s) Administered   Influenza,inj,Quad PF,6+ Mos 07/02/2018, 05/01/2019, 05/03/2020, 04/01/2021, 04/10/2022   Moderna SARS-COV2 Booster Vaccination 01/16/2020   Moderna Sars-Covid-2 Vaccination 03/19/2020   Tdap 07/02/2018    Past Medical History:  Diagnosis Date   Allergic reaction    Anemia    Asthma    COVID    Facial swelling    Neuromuscular disorder ([place]) 2015   bilateral legs and hands- spasms.     Tobacco History: Social History   Tobacco Use  Smoking Status Never  Smokeless Tobacco Former   Types: Snuff   Counseling given: Not Answered   Outpatient Encounter Medications as of 04/10/2022  Medication Sig   cholecalciferol (VITAMIN D3) 25 MCG (1000 UT) tablet Take 1,000 Units by mouth daily.   cyclobenzaprine (FLEXERIL) 10 MG tablet Take 1 tablet (10 mg total) by mouth 3 (three) times daily as needed for muscle spasms.   docusate sodium (STOOL SOFTENER) 100 MG capsule Take 1 capsule (100 mg total) by mouth 2 (two) times daily.   montelukast (SINGULAIR) 10 MG tablet Take 1 tablet (10 mg total) by mouth at bedtime.   predniSONE (DELTASONE) 10 MG tablet Take 4 tabs for 2 days, then 3 tabs for 2 days, then 2 tabs for 2 days, then 1 tab for 2 days, then stop   predniSONE (STERAPRED UNI-PAK 21 TAB) 10 MG (21) TBPK tablet Take as directed   [DISCONTINUED]  ibuprofen (ADVIL) 800 MG tablet Take 1 tablet (800 mg total) by mouth every 8 (eight) hours as needed for moderate pain. Most last 7 days.   ciclopirox (PENLAC) 8 % solution Apply topically at bedtime. Apply over nail and surrounding skin. Apply daily over previous coat. After seven (7) days, may remove with alcohol and continue cycle.   doxycycline (VIBRA-TABS) 100 MG tablet Take 1 tablet (100 mg total) by mouth 2 (two) times daily.   ibuprofen (ADVIL) 800 MG tablet Take 1 tablet (800 mg total) by mouth every 8 (eight) hours as needed for moderate pain. Most last 7 days.   lidocaine (HM LIDOCAINE PATCH) 4 % Place 1 patch onto the skin daily.   No facility-administered encounter medications on file as of 04/10/2022.     Review of Systems  Review of Systems  Constitutional: Negative.   HENT: Negative.    Cardiovascular: Negative.   Gastrointestinal: Negative.   Musculoskeletal:        Shoulder pain  Allergic/Immunologic: Negative.   Neurological: Negative.   Psychiatric/Behavioral: Negative.         Physical Exam  BP 125/76   Pulse 74   Temp 98.1 F (36.7 C) (Temporal)   Wt 171 lb 9.6 oz (77.8 kg)   SpO2 100%   BMI 26.88 kg/m   Wt Readings from Last 5 Encounters:  05/02/22 172  lb (78 kg)  04/10/22 171 lb 9.6 oz (77.8 kg)  11/20/21 178 lb (80.7 kg)  11/12/21 168 lb 3.2 oz (76.3 kg)  10/09/21 166 lb 9.6 oz (75.6 kg)     Physical Exam Vitals and nursing note reviewed.  Constitutional:      General: She is not in acute distress.    Appearance: She is well-developed.  Cardiovascular:     Rate and Rhythm: Normal rate and regular rhythm.  Pulmonary:     Effort: Pulmonary effort is normal.     Breath sounds: Normal breath sounds.  Neurological:     Mental Status: She is alert and oriented to person, place, and time.      Lab Results:  CBC    Component Value Date/Time   WBC 4.2 05/14/2022 1450   RBC 3.95 05/14/2022 1450   HGB 10.5 (L) 05/14/2022 1450   HCT  32.5 (L) 05/14/2022 1450   PLT 335 05/14/2022 1450   MCV 82 05/14/2022 1450   MCH 26.6 05/14/2022 1450   MCHC 32.3 05/14/2022 1450   RDW 12.1 05/14/2022 1450   LYMPHSABS 1.8 05/14/2022 1450   EOSABS 0.2 05/14/2022 1450   BASOSABS 0.0 05/14/2022 1450    BMET    Component Value Date/Time   NA 140 04/10/2022 1536   K 3.5 04/10/2022 1536   CL 103 04/10/2022 1536   CO2 23 04/10/2022 1536   GLUCOSE 124 (H) 04/10/2022 1536   BUN 14 04/10/2022 1536   CREATININE 0.78 04/10/2022 1536   CALCIUM 9.0 04/10/2022 1536   GFRNONAA 93 10/31/2019 1128   GFRAA 107 10/31/2019 1128    BNP No results found for: "BNP"  ProBNP No results found for: "PROBNP"  Imaging: No results found.   Assessment & Plan:   Scoliosis of lumbosacral spine - ibuprofen (ADVIL) 800 MG tablet; Take 1 tablet (800 mg total) by mouth every 8 (eight) hours as needed for moderate pain. Most last 7 days.  Dispense: 90 tablet; Refill: 1 - cyclobenzaprine (FLEXERIL) 10 MG tablet; Take 1 tablet (10 mg total) by mouth 3 (three) times daily as needed for muscle spasms.  Dispense: 30 tablet; Refill: 5  2. Encounter for screening mammogram for malignant neoplasm of breast   3. Acute pain of left shoulder  - predniSONE (DELTASONE) 10 MG tablet; Take 4 tabs for 2 days, then 3 tabs for 2 days, then 2 tabs for 2 days, then 1 tab for 2 days, then stop  Dispense: 20 tablet; Refill: 0  4. Routine health maintenance  - CBC - Comprehensive metabolic panel  5. Colon cancer screening  - Cologuard  6. Need for immunization against influenza  - Flu Vaccine QUAD 4mo+IM (Fluarix, Fluzone & Alfiuria Quad PF)   Follow up:  Follow up in 6 months or sooner if needed     Fenton Foy, NP 09/17/2022

## 2022-04-11 ENCOUNTER — Other Ambulatory Visit: Payer: Self-pay | Admitting: Nurse Practitioner

## 2022-04-11 DIAGNOSIS — Z1239 Encounter for other screening for malignant neoplasm of breast: Secondary | ICD-10-CM

## 2022-04-11 DIAGNOSIS — N644 Mastodynia: Secondary | ICD-10-CM

## 2022-04-11 LAB — COMPREHENSIVE METABOLIC PANEL
ALT: 9 IU/L (ref 0–32)
AST: 15 IU/L (ref 0–40)
Albumin/Globulin Ratio: 2.3 — ABNORMAL HIGH (ref 1.2–2.2)
Albumin: 4.3 g/dL (ref 3.9–4.9)
Alkaline Phosphatase: 49 IU/L (ref 44–121)
BUN/Creatinine Ratio: 18 (ref 9–23)
BUN: 14 mg/dL (ref 6–24)
Bilirubin Total: <0.2 mg/dL (ref 0.0–1.2)
CO2: 23 mmol/L (ref 20–29)
Calcium: 9 mg/dL (ref 8.7–10.2)
Chloride: 103 mmol/L (ref 96–106)
Creatinine, Ser: 0.78 mg/dL (ref 0.57–1.00)
Globulin, Total: 1.9 g/dL (ref 1.5–4.5)
Glucose: 124 mg/dL — ABNORMAL HIGH (ref 70–99)
Potassium: 3.5 mmol/L (ref 3.5–5.2)
Sodium: 140 mmol/L (ref 134–144)
Total Protein: 6.2 g/dL (ref 6.0–8.5)
eGFR: 94 mL/min/{1.73_m2} (ref >59)

## 2022-04-11 LAB — CBC
Hematocrit: 30.5 % — ABNORMAL LOW (ref 34.0–46.6)
Hemoglobin: 9.8 g/dL — ABNORMAL LOW (ref 11.1–15.9)
MCH: 27.1 pg (ref 26.6–33.0)
MCHC: 32.1 g/dL (ref 31.5–35.7)
MCV: 84 fL (ref 79–97)
Platelets: 324 10*3/uL (ref 150–450)
RBC: 3.62 x10E6/uL — ABNORMAL LOW (ref 3.77–5.28)
RDW: 12.5 % (ref 11.7–15.4)
WBC: 6 10*3/uL (ref 3.4–10.8)

## 2022-04-16 ENCOUNTER — Other Ambulatory Visit: Payer: Self-pay | Admitting: Nurse Practitioner

## 2022-04-16 DIAGNOSIS — N644 Mastodynia: Secondary | ICD-10-CM

## 2022-04-22 LAB — COLOGUARD: COLOGUARD: NEGATIVE

## 2022-05-02 ENCOUNTER — Ambulatory Visit (INDEPENDENT_AMBULATORY_CARE_PROVIDER_SITE_OTHER): Payer: Commercial Managed Care - HMO | Admitting: Nurse Practitioner

## 2022-05-02 ENCOUNTER — Ambulatory Visit (HOSPITAL_COMMUNITY): Admission: RE | Admit: 2022-05-02 | Payer: Commercial Managed Care - HMO | Source: Ambulatory Visit

## 2022-05-02 ENCOUNTER — Encounter: Payer: Self-pay | Admitting: Nurse Practitioner

## 2022-05-02 ENCOUNTER — Encounter (HOSPITAL_COMMUNITY): Payer: Self-pay

## 2022-05-02 VITALS — BP 126/77 | HR 100 | Temp 98.2°F | Ht 67.0 in | Wt 172.0 lb

## 2022-05-02 DIAGNOSIS — M25512 Pain in left shoulder: Secondary | ICD-10-CM

## 2022-05-02 DIAGNOSIS — M7502 Adhesive capsulitis of left shoulder: Secondary | ICD-10-CM

## 2022-05-02 DIAGNOSIS — G8929 Other chronic pain: Secondary | ICD-10-CM | POA: Diagnosis not present

## 2022-05-02 MED ORDER — KETOROLAC TROMETHAMINE 30 MG/ML IJ SOLN
30.0000 mg | Freq: Once | INTRAMUSCULAR | Status: AC
  Administered 2022-05-02: 30 mg via INTRAMUSCULAR

## 2022-05-02 MED ORDER — MELOXICAM 7.5 MG PO TABS: 7.5000 mg | ORAL_TABLET | Freq: Every day | ORAL | 0 refills | Status: AC

## 2022-05-02 MED ORDER — DICLOFENAC SODIUM 1 % EX GEL: 2.0000 g | Freq: Four times a day (QID) | CUTANEOUS | 0 refills | Status: DC
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
## 2022-05-02 NOTE — Patient Instructions (Addendum)
1. Chronic left shoulder pain  - Ambulatory referral to Orthopedic Surgery - DG Shoulder Left - diclofenac Sodium (VOLTAREN) 1 % GEL; Apply 2 g topically 4 (four) times daily.  Dispense: 2 g; Refill: 0 - meloxicam (MOBIC) 7.5 MG tablet; Take 1 tablet (7.5 mg total) by mouth daily.  Dispense: 30 tablet; Refill: 0  2. Adhesive capsulitis of left shoulder  - Ambulatory referral to Orthopedic Surgery - DG Shoulder Left - diclofenac Sodium (VOLTAREN) 1 % GEL; Apply 2 g topically 4 (four) times daily.  Dispense: 2 g; Refill: 0 - meloxicam (MOBIC) 7.5 MG tablet; Take 1 tablet (7.5 mg total) by mouth daily.  Dispense: 30 tablet; Refill: 0  Toradol given in office today   Follow up:  Follow up as needed     Adhesive Capsulitis  Adhesive capsulitis, also called frozen shoulder, causes the shoulder to become stiff and painful to move. This condition happens when there is inflammation of the tendons and ligaments that surround the shoulder joint (shoulder capsule). What are the causes? This condition may be caused by: An injury to your shoulder joint. Straining your shoulder. Not moving your shoulder for a period of time. This can happen if your arm was injured or in a sling. Long-standing conditions, such as: Diabetes. Thyroid problems. Heart disease. Stroke. Rheumatoid arthritis. Lung disease. In some cases, the cause is not known. What increases the risk? You are more likely to develop this condition if you are: A woman. Older than 47 years of age. What are the signs or symptoms? Symptoms of this condition include: Pain in your shoulder when you move your arm. There may also be pain when parts of your shoulder are touched. The pain may be worse at night or when you are resting. A sore or aching shoulder. The inability to move your shoulder normally. Muscle spasms. How is this diagnosed? This condition is diagnosed with a physical exam and imaging tests, such as an X-ray or  MRI. How is this treated? This condition may be treated with: Treatment of the underlying cause or condition. Medicine. Medicine may be given to relieve pain, inflammation, or muscle spasms. Steroid injections into the shoulder joint. Physical therapy. This involves performing exercises to get the shoulder moving again. Acupuncture. This is a type of treatment that involves stimulating specific points on your body by inserting thin needles through your skin. Shoulder manipulation. This is a procedure to move the shoulder into another position. It is done after you are given a medicine to make you fall asleep (general anesthetic). The joint may also be injected with salt water at high pressure to break down scarring. Surgery. This may be done in severe cases when other treatments have failed. Although most people recover completely from adhesive capsulitis, some may not regain full shoulder movement. Follow these instructions at home: Managing pain, stiffness, and swelling     If directed, put ice on the injured area: Put ice in a plastic bag. Place a towel between your skin and the bag. Leave the ice on for 20 minutes, 2-3 times per day. If directed, apply heat to the affected area before you exercise. Use the heat source that your health care provider recommends, such as a moist heat pack or a heating pad. Place a towel between your skin and the heat source. Leave the heat on for 20-30 minutes. Remove the heat if your skin turns bright red. This is especially important if you are unable to feel pain, heat, or  cold. You may have a greater risk of getting burned. General instructions Take over-the-counter and prescription medicines only as told by your health care provider. If you are being treated with physical therapy, follow instructions from your physical therapist. Avoid exercises that put a lot of demand on your shoulder, such as throwing. These exercises can make pain worse. Keep  all follow-up visits as told by your health care provider. This is important. Contact a health care provider if: You develop new symptoms. Your symptoms get worse. Summary Adhesive capsulitis, also called frozen shoulder, causes the shoulder to become stiff and painful to move. You are more likely to have this condition if you are a woman and over age 60. It is treated with physical therapy, medicines, and sometimes surgery. This information is not intended to replace advice given to you by your health care provider. Make sure you discuss any questions you have with your health care provider. Document Revised: 02/11/2021 Document Reviewed: 02/11/2021 Elsevier Patient Education  [place].

## 2022-05-05 ENCOUNTER — Ambulatory Visit (HOSPITAL_COMMUNITY)
Admission: RE | Admit: 2022-05-05 | Discharge: 2022-05-05 | Disposition: A | Discharge status: Home / Self Care | Payer: Commercial Managed Care - HMO | Source: Ambulatory Visit | Attending: Nurse Practitioner | Admitting: Nurse Practitioner

## 2022-05-05 DIAGNOSIS — G8929 Other chronic pain: Secondary | ICD-10-CM | POA: Diagnosis present

## 2022-05-05 DIAGNOSIS — M7502 Adhesive capsulitis of left shoulder: Secondary | ICD-10-CM | POA: Diagnosis present

## 2022-05-05 DIAGNOSIS — M25512 Pain in left shoulder: Secondary | ICD-10-CM | POA: Diagnosis present

## 2022-05-06 ENCOUNTER — Telehealth: Payer: Self-pay

## 2022-05-06 NOTE — Telephone Encounter (Signed)
Pt was called because [facility] is wanting to fill pt medication. Per rep pt requested a medication to be fill . Pt was called but no answer so lvm. [place]

## 2022-05-07 ENCOUNTER — Ambulatory Visit: Payer: Commercial Managed Care - HMO | Admitting: Orthopaedic Surgery

## 2022-05-14 ENCOUNTER — Other Ambulatory Visit: Payer: Self-pay

## 2022-05-14 ENCOUNTER — Other Ambulatory Visit: Payer: Commercial Managed Care - HMO

## 2022-05-14 DIAGNOSIS — D649 Anemia, unspecified: Secondary | ICD-10-CM

## 2022-05-15 LAB — CBC WITH DIFFERENTIAL/PLATELET
Basophils Absolute: 0 10*3/uL (ref 0.0–0.2)
Basos: 1 %
EOS (ABSOLUTE): 0.2 10*3/uL (ref 0.0–0.4)
Eos: 4 %
Hematocrit: 32.5 % — ABNORMAL LOW (ref 34.0–46.6)
Hemoglobin: 10.5 g/dL — ABNORMAL LOW (ref 11.1–15.9)
Immature Grans (Abs): 0 10*3/uL (ref 0.0–0.1)
Immature Granulocytes: 0 %
Lymphocytes Absolute: 1.8 10*3/uL (ref 0.7–3.1)
Lymphs: 44 %
MCH: 26.6 pg (ref 26.6–33.0)
MCHC: 32.3 g/dL (ref 31.5–35.7)
MCV: 82 fL (ref 79–97)
Monocytes Absolute: 0.3 10*3/uL (ref 0.1–0.9)
Monocytes: 7 %
Neutrophils Absolute: 1.9 10*3/uL (ref 1.4–7.0)
Neutrophils: 44 %
Platelets: 335 10*3/uL (ref 150–450)
RBC: 3.95 x10E6/uL (ref 3.77–5.28)
RDW: 12.1 % (ref 11.7–15.4)
WBC: 4.2 10*3/uL (ref 3.4–10.8)

## 2022-05-21 ENCOUNTER — Ambulatory Visit: Payer: Commercial Managed Care - HMO | Admitting: Orthopaedic Surgery

## 2022-05-27 ENCOUNTER — Ambulatory Visit: Payer: Self-pay

## 2022-05-27 ENCOUNTER — Ambulatory Visit: Payer: Commercial Managed Care - HMO | Admitting: Orthopaedic Surgery

## 2022-05-27 ENCOUNTER — Ambulatory Visit: Payer: Commercial Managed Care - HMO | Admitting: Physician Assistant

## 2022-05-27 DIAGNOSIS — G8929 Other chronic pain: Secondary | ICD-10-CM | POA: Diagnosis not present

## 2022-05-27 DIAGNOSIS — M25512 Pain in left shoulder: Secondary | ICD-10-CM | POA: Diagnosis not present

## 2022-05-27 MED ORDER — TRAMADOL HCL 50 MG PO TABS: 50.0000 mg | ORAL_TABLET | Freq: Two times a day (BID) | ORAL | 2 refills | Status: AC | PRN

## 2022-05-27 NOTE — Progress Notes (Unsigned)
   Procedure Note  Patient: Melanie Ortiz             Date of Birth: June 13, 1976           MRN: [id number]             Visit Date: 05/27/2022  Procedures: Visit Diagnoses:  1. Chronic left shoulder pain    Large Joint Inj: L glenohumeral on 05/27/2022 3:42 PM Indications: pain Details: 22 G 3.5 in needle, ultrasound-guided posterior approach Medications: 2 mL lidocaine 1 %; 2 mL bupivacaine 0.25 %; 80 mg methylPREDNISolone acetate 40 MG/ML Outcome: tolerated well, no immediate complications  US-guided glenohumeral joint injection, left shoulder After discussion on risks/benefits/indications, informed verbal consent was obtained. A timeout was then performed. The patient was positioned lying lateral recumbent on examination table. The patient's shoulder was prepped with betadine and multiple alcohol swabs and utilizing ultrasound guidance, the patient's glenohumeral joint was identified on ultrasound. Using ultrasound guidance a 22-gauge, 3.5 inch needle with a mixture of 2:2:2 cc's lidocaine:bupivicaine:depomedrol was directed from a lateral to medial direction via in-plane technique into the glenohumeral joint with visualization of appropriate spread of injectate into the joint. Patient tolerated the procedure well without immediate complications.      Procedure, treatment alternatives, risks and benefits explained, specific risks discussed. Consent was given by the patient. Immediately prior to procedure a time out was called to verify the correct patient, procedure, equipment, support staff and site/side marked as required. Patient was prepped and draped in the usual sterile fashion.     - I evaluated the patient about 10 minutes post-injection and she had good improvement in pain and range of motion - follow-up with Mendel Ryder, Dr. Erlinda Hong and team as indicated; I am happy to see them as needed  Elba Barman, DO [place]  This note was dictated using Dragon naturally speaking software and may contain errors in syntax, spelling, or content which have not been identified prior to signing this note.

## 2022-05-27 NOTE — Progress Notes (Signed)
Office Visit Note   Patient: Melanie Ortiz           Date of Birth: 1975/07/10           MRN: [id number] Visit Date: 05/27/2022              Requested by: Fenton Foy, NP [phone] [address] [place],  [place] [address] PCP: Fenton Foy, NP   Assessment & Plan: Visit Diagnoses:  1. Chronic left shoulder pain     Plan: Impression is left shoulder biceps tendinitis.  Today, we discussed various treatment options to include continuation of oral NSAIDs which have not proved to alleviate her symptoms versus intra-articular cortisone injection.  She would like to proceed with injection.  We will make referral to Dr. Rolena Infante for this.  I also sent in tramadol to take as needed.  Follow-up as needed.   Follow-Up Instructions:  Return if symptoms worsen or fail to improve.   Orders:  No orders of the defined types were placed in this encounter.  Meds ordered this encounter  Medications   traMADol (ULTRAM) 50 MG tablet    Sig: Take 1 tablet (50 mg total) by mouth every 12 (twelve) hours as needed.    Dispense:  30 tablet    Refill:  2      Procedures: No procedures performed   Clinical Data: No additional findings.   Subjective: Chief Complaint  Patient presents with   Left Shoulder - Pain    HPI patient is a pleasant 46 year old female who comes in today with left shoulder pain for the past month which is progressively worsened.  She works as a Secretary/administrator for [employer] and was having to toe heavy chemicals up and down stairs which has seemed to cause her symptoms.  She primarily has pain with internal rotation as well as when she is lying down at night.  She has used Voltaren gel and meloxicam without significant relief.  She does feel some weakness at times.  Review of Systems as detailed in HPI.  All others reviewed and are negative.   Objective: Vital Signs: There were no vitals taken for this visit.  Physical Exam well-developed well-nourished  female in no acute distress.  Alert and oriented x3.  Ortho Exam left shoulder exam reveals full active range of motion in all planes.  Full strength throughout.  Very minimal pain with empty can test.  She does have pain with O'Brien's test and speeds test.  Negative cross body adduction test.  She is neurovascular intact distally.  Specialty Comments:  No specialty comments available.  Imaging: No new imaging   PMFS History: Patient Active Problem List   Diagnosis Date Noted   Chronic left hip pain 11/20/2021   Muscle spasms of both lower extremities 10/11/2021   Uterine leiomyoma 07/25/2019   Menorrhagia 07/25/2019   Dysmenorrhea 07/25/2019   Anemia 07/25/2019   Facial swelling 08/25/2018   Allergic reaction 08/25/2018   Bacterial vaginosis 05/17/2015   Past Medical History:  Diagnosis Date   Allergic reaction    Anemia    Asthma    COVID    Facial swelling    Neuromuscular disorder ([facility]) 2015   bilateral legs and hands- spasms.     Family History  Problem Relation Age of Onset   Cancer Mother        cervical    GER disease Mother    Diabetes Brother    Ovarian cysts Daughter  ADD / ADHD Son    Breast cancer Cousin     Past Surgical History:  Procedure Laterality Date   ABDOMINAL HYSTERECTOMY     04/25/2015   CHOLECYSTECTOMY     Social History   Occupational History   Not on file  Tobacco Use   Smoking status: Never   Smokeless tobacco: Former    Types: Snuff  Vaping Use   Vaping Use: Never used  Substance and Sexual Activity   Alcohol use: Not Currently    Alcohol/week: 3.0 standard drinks of alcohol    Types: 3 Cans of beer per week    Comment: everyday    Drug use: Never   Sexual activity: Yes    Birth control/protection: Post-menopausal

## 2022-05-28 MED ORDER — LIDOCAINE HCL 1 % IJ SOLN
2.0000 mL | INTRAMUSCULAR | Status: AC | PRN
  Administered 2022-05-27: 2 mL

## 2022-05-28 MED ORDER — BUPIVACAINE HCL 0.25 % IJ SOLN
2.0000 mL | INTRAMUSCULAR | Status: AC | PRN
  Administered 2022-05-27: 2 mL via INTRA_ARTICULAR

## 2022-05-28 MED ORDER — METHYLPREDNISOLONE ACETATE 40 MG/ML IJ SUSP
80.0000 mg | INTRAMUSCULAR | Status: AC | PRN
  Administered 2022-05-27: 80 mg via INTRA_ARTICULAR

## 2022-06-02 ENCOUNTER — Other Ambulatory Visit: Payer: Medicaid Other

## 2022-06-17 ENCOUNTER — Other Ambulatory Visit: Payer: Medicaid Other

## 2022-07-01 ENCOUNTER — Other Ambulatory Visit: Payer: Medicaid Other

## 2022-07-11 ENCOUNTER — Other Ambulatory Visit: Payer: Self-pay | Admitting: Nurse Practitioner

## 2022-07-11 ENCOUNTER — Ambulatory Visit
Admission: RE | Admit: 2022-07-11 | Discharge: 2022-07-11 | Disposition: A | Discharge status: Home / Self Care | Payer: Medicaid Other | Source: Ambulatory Visit | Attending: Nurse Practitioner | Admitting: Nurse Practitioner

## 2022-07-11 ENCOUNTER — Ambulatory Visit
Admission: RE | Admit: 2022-07-11 | Discharge: 2022-07-11 | Disposition: A | Discharge status: Home / Self Care | Payer: BLUE CROSS/BLUE SHIELD | Source: Ambulatory Visit | Attending: Nurse Practitioner | Admitting: Nurse Practitioner

## 2022-07-11 DIAGNOSIS — N6320 Unspecified lump in the left breast, unspecified quadrant: Secondary | ICD-10-CM

## 2022-07-11 DIAGNOSIS — N644 Mastodynia: Secondary | ICD-10-CM

## 2022-09-17 ENCOUNTER — Encounter: Payer: Self-pay | Admitting: Nurse Practitioner

## 2022-09-17 DIAGNOSIS — M419 Scoliosis, unspecified: Secondary | ICD-10-CM | POA: Insufficient documentation

## 2022-09-17 NOTE — Assessment & Plan Note (Signed)
-   ibuprofen (ADVIL) 800 MG tablet; Take 1 tablet (800 mg total) by mouth every 8 (eight) hours as needed for moderate pain. Most last 7 days.  Dispense: 90 tablet; Refill: 1 - cyclobenzaprine (FLEXERIL) 10 MG tablet; Take 1 tablet (10 mg total) by mouth 3 (three) times daily as needed for muscle spasms.  Dispense: 30 tablet; Refill: 5  2. Encounter for screening mammogram for malignant neoplasm of breast   3. Acute pain of left shoulder  - predniSONE (DELTASONE) 10 MG tablet; Take 4 tabs for 2 days, then 3 tabs for 2 days, then 2 tabs for 2 days, then 1 tab for 2 days, then stop  Dispense: 20 tablet; Refill: 0  4. Routine health maintenance  - CBC - Comprehensive metabolic panel  5. Colon cancer screening  - Cologuard  6. Need for immunization against influenza  - Flu Vaccine QUAD 31mo+IM (Fluarix, Fluzone & Alfiuria Quad PF)   Follow up:  Follow up in 6 months or sooner if needed

## 2022-10-10 ENCOUNTER — Ambulatory Visit (INDEPENDENT_AMBULATORY_CARE_PROVIDER_SITE_OTHER): Payer: Commercial Managed Care - HMO | Admitting: Nurse Practitioner

## 2022-10-10 VITALS — BP 115/83 | HR 77 | Temp 97.2°F | Ht 67.5 in | Wt 161.4 lb

## 2022-10-10 DIAGNOSIS — G8929 Other chronic pain: Secondary | ICD-10-CM | POA: Diagnosis not present

## 2022-10-10 DIAGNOSIS — M419 Scoliosis, unspecified: Secondary | ICD-10-CM

## 2022-10-10 DIAGNOSIS — M25512 Pain in left shoulder: Secondary | ICD-10-CM

## 2022-10-10 DIAGNOSIS — M7502 Adhesive capsulitis of left shoulder: Secondary | ICD-10-CM | POA: Diagnosis not present

## 2022-10-10 MED ORDER — CYCLOBENZAPRINE HCL 10 MG PO TABS
10.0000 mg | ORAL_TABLET | Freq: Three times a day (TID) | ORAL | 5 refills | Status: DC | PRN
Start: 2022-10-10 — End: 2023-08-10

## 2022-10-10 MED ORDER — IBUPROFEN 800 MG PO TABS
800.0000 mg | ORAL_TABLET | Freq: Three times a day (TID) | ORAL | 1 refills | Status: DC | PRN
Start: 2022-10-10 — End: 2023-08-10

## 2022-10-10 MED ORDER — THERAWORX RELIEF EX FOAM
1.0000 | Freq: Two times a day (BID) | CUTANEOUS | 2 refills | Status: AC
Start: 2022-10-10 — End: ?

## 2022-10-10 NOTE — Patient Instructions (Addendum)
1. Chronic left shoulder pain  - Homeopathic Products (THERAWORX RELIEF) FOAM; Apply 1 application  topically 2 (two) times daily.  Dispense: 210 mL; Refill: 2  2. Scoliosis of lumbosacral spine, unspecified scoliosis type  - cyclobenzaprine (FLEXERIL) 10 MG tablet; Take 1 tablet (10 mg total) by mouth 3 (three) times daily as needed for muscle spasms.  Dispense: 30 tablet; Refill: 5 - ibuprofen (ADVIL) 800 MG tablet; Take 1 tablet (800 mg total) by mouth every 8 (eight) hours as needed for moderate pain. Most last 7 days.  Dispense: 90 tablet; Refill: 1  Follow up:  Follow up in 3 months     Adhesive Capsulitis  Adhesive capsulitis, also called frozen shoulder, causes the shoulder to become stiff and painful to move. This condition happens when there is inflammation of the tendons and ligaments that surround the shoulder joint (shoulder capsule). Tendons are tissues that connect muscle to bone. Ligaments are tissues that connect bones to each other. What are the causes? This condition may be caused by: An injury to your shoulder joint. Straining your shoulder. Not moving your shoulder for a period of time. This can happen if your arm was injured or in a sling. In some cases, the cause is not known. What increases the risk? You are more likely to develop this condition if: You are female. You are older than 48 years of age. You have certain other conditions, such as: Diabetes. Thyroid problems. Stroke. You recently had surgery, especially shoulder or neck surgery. What are the signs or symptoms? Symptoms of this condition include: Pain in your shoulder when you move your arm. There may also be pain when parts of your shoulder are touched. The pain may be worse at night or when you are resting. Not being able to move your shoulder normally. Sudden muscle tightening (muscle spasms). How is this diagnosed? This condition is diagnosed with a physical exam and imaging tests, such  as an X-ray or MRI. How is this treated? This condition may be treated with: Treatment of the injury or condition that caused the adhesive capsulitis. Medicines for pain, inflammation, or muscle spasms. Injections of medicine (steroids) into the shoulder joint. Physical therapy. This involves doing exercises to get the shoulder moving again. Shoulder manipulation. This is a procedure to move the shoulder into another position. It is done after you are given a medicine to make you fall asleep (general anesthesia). The joint may also be injected with salt water at high pressure to break down scarring. Surgery. This may be done in severe cases when other treatments do not work. Some less common treatments include: Injection of hyaluronic acid into the shoulder joint. This substance is a normal part of the fluid inside joints. It helps lubricate the joint and can lower inflammation. Injection of platelet-rich plasma into the shoulder joint. This uses a type of blood cell from your own blood that may speed up the healing process. Sending energy waves to the affected area (extracorporeal shock wave therapy). Most people recover completely from adhesive capsulitis, but some may not get back full shoulder movement. Follow these instructions at home: Managing pain, stiffness, and swelling     If told, put ice on the injured area. Put ice in a plastic bag. Place a towel between your skin and the bag. Leave the ice on for 20 minutes, 2-3 times a day. If told, apply heat to the affected area before you exercise. Use the heat source that your health care provider recommends,  such as a moist heat pack or a heating pad. Place a towel between your skin and the heat source. Leave the heat on for 20-30 minutes. If your skin turns bright red, remove the ice or heat right away to prevent skin damage. The risk of damage is higher if you cannot feel pain, heat, or cold. General instructions Take  over-the-counter and prescription medicines only as told by your provider. If you are being treated with physical therapy, do exercises as told. Avoid activities or exercises that put a lot of demand on your shoulder, such as throwing. These can make pain worse. Contact a health care provider if: You have new symptoms. Your symptoms get worse. This information is not intended to replace advice given to you by your health care provider. Make sure you discuss any questions you have with your health care provider. Document Revised: 03/31/2022 Document Reviewed: 03/31/2022 Elsevier Patient Education  2023 [employer].

## 2022-10-10 NOTE — Progress Notes (Signed)
@Patient  ID: Melanie Ortiz, female    DOB: 02/05/1976, 47 y.o.   MRN: [id number]  Chief Complaint  Patient presents with   Shoulder Pain    Left     Referring provider: Ivonne Andrew, NP   HPI  Melanie Ortiz presents for follow up. She  has a past medical history of Allergic reaction, Anemia, COVID, Facial swelling, and Neuromuscular disorder ([facility]) (2015).    Patient presents today for a follow-up on shoulder pain. She has had previous treatment steroids for left shoulder pain.  She states that this did not help.  Patient has seen orthopedics and did have joint injection.  This was in November of last year.  Patient does have decreased range of motion.  She cannot move her arm behind her back.  She can raise her arm above her head.  Denies f/c/s, n/v/d, hemoptysis, PND, leg swelling Denies chest pain or edema  Patient is also requesting a refill on Flexeril and Advil which she takes for scoliosis of the lumbosacral spine  Allergies  Allergen Reactions   Onion Swelling   Zanaflex [Tizanidine]     Dizziness, confusion    Immunization History  Administered Date(s) Administered   Influenza,inj,Quad PF,6+ Mos 07/02/2018, 05/01/2019, 05/03/2020, 04/01/2021, 04/10/2022   Moderna SARS-COV2 Booster Vaccination 01/16/2020   Moderna Sars-Covid-2 Vaccination 03/19/2020   Tdap 07/02/2018    Past Medical History:  Diagnosis Date   Allergic reaction    Anemia    Asthma    COVID    Facial swelling    Neuromuscular disorder ([facility]) 2015   bilateral legs and hands- spasms.     Tobacco History: Social History   Tobacco Use  Smoking Status Never  Smokeless Tobacco Former   Types: Snuff   Counseling given: Not Answered   Outpatient Encounter Medications as of 10/10/2022  Medication Sig   cholecalciferol (VITAMIN D3) 25 MCG (1000 UT) tablet Take 1,000 Units by mouth daily.   diclofenac Sodium (VOLTAREN) 1 % GEL Apply 2 g topically 4 (four) times daily.   docusate sodium  (STOOL SOFTENER) 100 MG capsule Take 1 capsule (100 mg total) by mouth 2 (two) times daily.   doxycycline (VIBRA-TABS) 100 MG tablet Take 1 tablet (100 mg total) by mouth 2 (two) times daily.   Homeopathic Products (THERAWORX RELIEF) FOAM Apply 1 application  topically 2 (two) times daily.   lidocaine (HM LIDOCAINE PATCH) 4 % Place 1 patch onto the skin daily.   meloxicam (MOBIC) 7.5 MG tablet Take 1 tablet (7.5 mg total) by mouth daily.   montelukast (SINGULAIR) 10 MG tablet Take 1 tablet (10 mg total) by mouth at bedtime.   [DISCONTINUED] cyclobenzaprine (FLEXERIL) 10 MG tablet Take 1 tablet (10 mg total) by mouth 3 (three) times daily as needed for muscle spasms.   [DISCONTINUED] ibuprofen (ADVIL) 800 MG tablet Take 1 tablet (800 mg total) by mouth every 8 (eight) hours as needed for moderate pain. Most last 7 days.   ciclopirox (PENLAC) 8 % solution Apply topically at bedtime. Apply over nail and surrounding skin. Apply daily over previous coat. After seven (7) days, may remove with alcohol and continue cycle. (Patient not taking: Reported on 10/10/2022)   cyclobenzaprine (FLEXERIL) 10 MG tablet Take 1 tablet (10 mg total) by mouth 3 (three) times daily as needed for muscle spasms.   ibuprofen (ADVIL) 800 MG tablet Take 1 tablet (800 mg total) by mouth every 8 (eight) hours as needed for moderate pain. Most last  7 days.   predniSONE (DELTASONE) 10 MG tablet Take 4 tabs for 2 days, then 3 tabs for 2 days, then 2 tabs for 2 days, then 1 tab for 2 days, then stop (Patient not taking: Reported on 10/10/2022)   predniSONE (STERAPRED UNI-PAK 21 TAB) 10 MG (21) TBPK tablet Take as directed (Patient not taking: Reported on 10/10/2022)   traMADol (ULTRAM) 50 MG tablet Take 1 tablet (50 mg total) by mouth every 12 (twelve) hours as needed. (Patient not taking: Reported on 10/10/2022)   No facility-administered encounter medications on file as of 10/10/2022.     Review of Systems  Review of Systems   Constitutional: Negative.   HENT: Negative.    Cardiovascular: Negative.   Gastrointestinal: Negative.   Musculoskeletal:        Left shoulder pain with decreased ROM  Allergic/Immunologic: Negative.   Neurological: Negative.   Psychiatric/Behavioral: Negative.         Physical Exam  BP 115/83   Pulse 77   Temp (!) 97.2 F (36.2 C)   Ht 5' 7.5" (1.715 m)   Wt 161 lb 6.4 oz (73.2 kg)   SpO2 100%   BMI 24.91 kg/m   Wt Readings from Last 5 Encounters:  10/10/22 161 lb 6.4 oz (73.2 kg)  05/02/22 172 lb (78 kg)  04/10/22 171 lb 9.6 oz (77.8 kg)  11/20/21 178 lb (80.7 kg)  11/12/21 168 lb 3.2 oz (76.3 kg)     Physical Exam Vitals and nursing note reviewed.  Constitutional:      General: She is not in acute distress.    Appearance: She is well-developed.  Cardiovascular:     Rate and Rhythm: Normal rate and regular rhythm.  Pulmonary:     Effort: Pulmonary effort is normal.     Breath sounds: Normal breath sounds.  Musculoskeletal:     Left shoulder: Tenderness present. Decreased range of motion.  Neurological:     Mental Status: She is alert and oriented to person, place, and time.      Lab Results:  CBC    Component Value Date/Time   WBC 4.2 05/14/2022 1450   RBC 3.95 05/14/2022 1450   HGB 10.5 (L) 05/14/2022 1450   HCT 32.5 (L) 05/14/2022 1450   PLT 335 05/14/2022 1450   MCV 82 05/14/2022 1450   MCH 26.6 05/14/2022 1450   MCHC 32.3 05/14/2022 1450   RDW 12.1 05/14/2022 1450   LYMPHSABS 1.8 05/14/2022 1450   EOSABS 0.2 05/14/2022 1450   BASOSABS 0.0 05/14/2022 1450    BMET    Component Value Date/Time   NA 140 04/10/2022 1536   K 3.5 04/10/2022 1536   CL 103 04/10/2022 1536   CO2 23 04/10/2022 1536   GLUCOSE 124 (H) 04/10/2022 1536   BUN 14 04/10/2022 1536   CREATININE 0.78 04/10/2022 1536   CALCIUM 9.0 04/10/2022 1536   GFRNONAA 93 10/31/2019 1128   GFRAA 107 10/31/2019 1128    BNP No results found for: "BNP"  ProBNP No results  found for: "PROBNP"  Imaging: No results found.   Assessment & Plan:   Chronic left shoulder pain - Homeopathic Products (THERAWORX RELIEF) FOAM; Apply 1 application  topically 2 (two) times daily.  Dispense: 210 mL; Refill: 2  2. Scoliosis of lumbosacral spine, unspecified scoliosis type  - cyclobenzaprine (FLEXERIL) 10 MG tablet; Take 1 tablet (10 mg total) by mouth 3 (three) times daily as needed for muscle spasms.  Dispense: 30 tablet; Refill: 5 -  ibuprofen (ADVIL) 800 MG tablet; Take 1 tablet (800 mg total) by mouth every 8 (eight) hours as needed for moderate pain. Most last 7 days.  Dispense: 90 tablet; Refill: 1  Follow up:  Follow up in 3 months     Ivonne Andrew, NP 10/13/2022

## 2022-10-13 ENCOUNTER — Telehealth: Payer: Self-pay | Admitting: Nurse Practitioner

## 2022-10-13 ENCOUNTER — Encounter: Payer: Self-pay | Admitting: Nurse Practitioner

## 2022-10-13 DIAGNOSIS — G8929 Other chronic pain: Secondary | ICD-10-CM | POA: Insufficient documentation

## 2022-10-13 NOTE — Telephone Encounter (Signed)
Referral person called and said that this pt need to be change to [facility]   She said the referral that was sent was incorrect.

## 2022-10-13 NOTE — Assessment & Plan Note (Signed)
-   Homeopathic Products (THERAWORX RELIEF) FOAM; Apply 1 application  topically 2 (two) times daily.  Dispense: 210 mL; Refill: 2  2. Scoliosis of lumbosacral spine, unspecified scoliosis type  - cyclobenzaprine (FLEXERIL) 10 MG tablet; Take 1 tablet (10 mg total) by mouth 3 (three) times daily as needed for muscle spasms.  Dispense: 30 tablet; Refill: 5 - ibuprofen (ADVIL) 800 MG tablet; Take 1 tablet (800 mg total) by mouth every 8 (eight) hours as needed for moderate pain. Most last 7 days.  Dispense: 90 tablet; Refill: 1  Follow up:  Follow up in 3 months

## 2022-12-16 ENCOUNTER — Other Ambulatory Visit: Payer: Commercial Managed Care - HMO

## 2023-01-16 ENCOUNTER — Ambulatory Visit
Admission: RE | Admit: 2023-01-16 | Discharge: 2023-01-16 | Disposition: A | Discharge status: Home / Self Care | Payer: 59 | Source: Ambulatory Visit | Attending: Nurse Practitioner | Admitting: Nurse Practitioner

## 2023-01-16 DIAGNOSIS — D242 Benign neoplasm of left breast: Secondary | ICD-10-CM | POA: Diagnosis not present

## 2023-01-16 DIAGNOSIS — N6320 Unspecified lump in the left breast, unspecified quadrant: Secondary | ICD-10-CM

## 2023-02-02 ENCOUNTER — Ambulatory Visit (HOSPITAL_COMMUNITY)
Admission: EM | Admit: 2023-02-02 | Discharge: 2023-02-02 | Disposition: A | Discharge status: Home / Self Care | Payer: 59

## 2023-05-10 IMAGING — MG DIGITAL DIAGNOSTIC BILAT W/ TOMO W/ CAD
8 of 13 series · 8 of 33 positions shown · non-contrast
Comparison: Previous exam(s).

CLINICAL DATA: 45-year-old female presenting for six-month
follow-up of a biopsy proven left breast fibroadenoma as well as 1
year follow-up of probably benign right breast calcifications and
additional probably benign bilateral breast masses.



[R ML (1 of 2)]
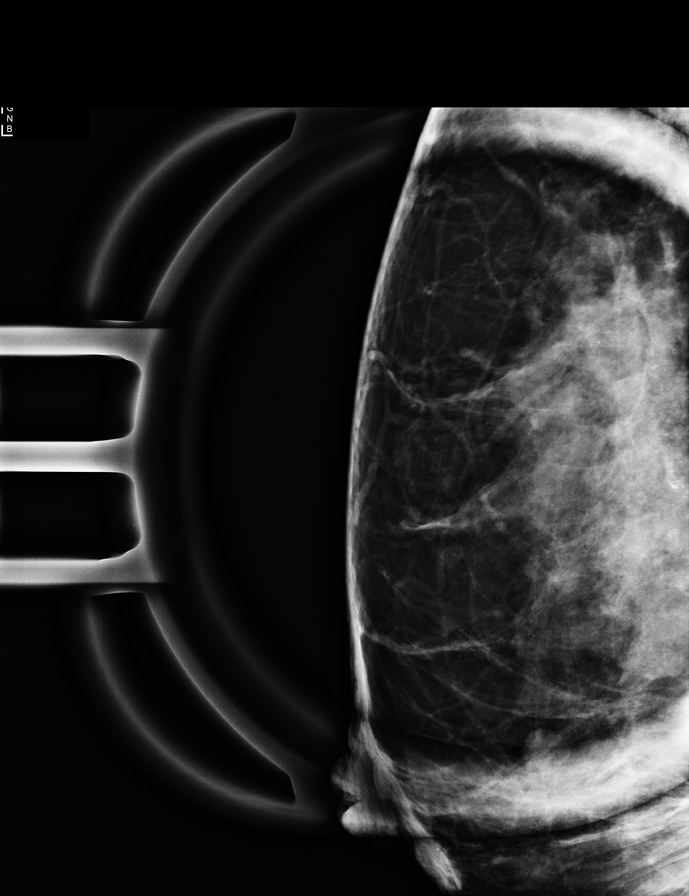

[R CC]
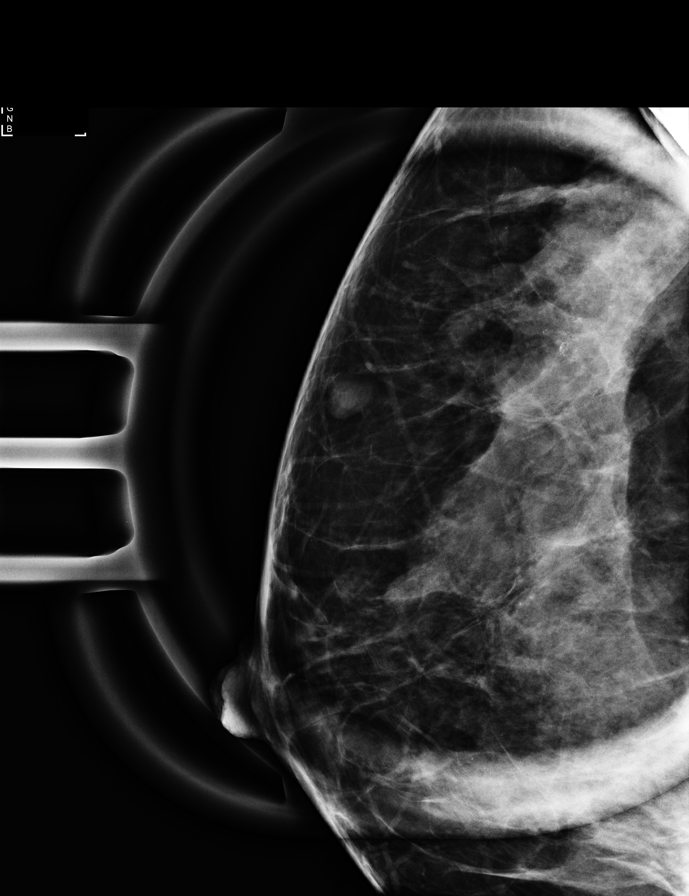

[R ML (2 of 2)]
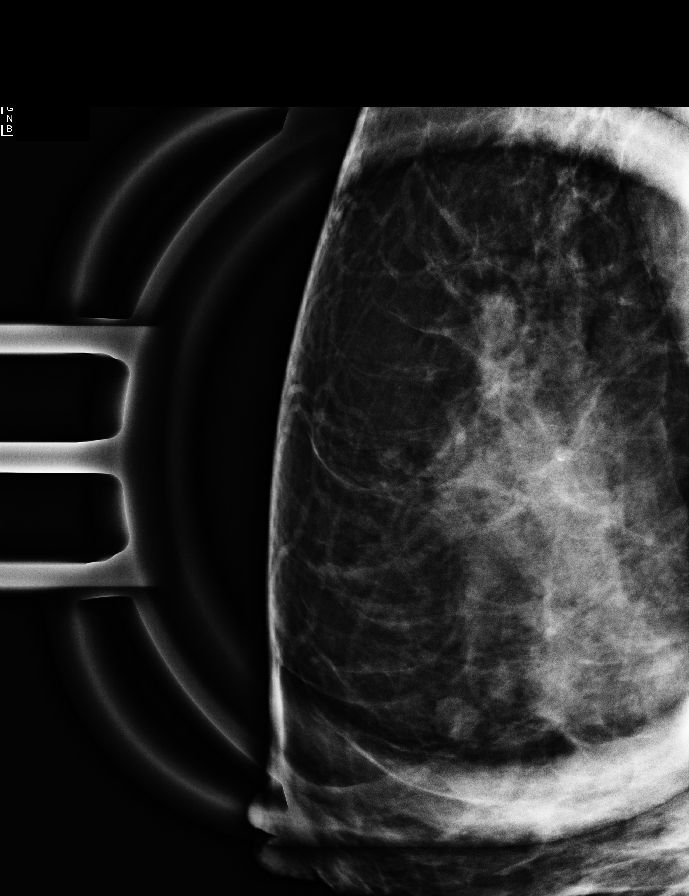

[L MLO synth-2D]
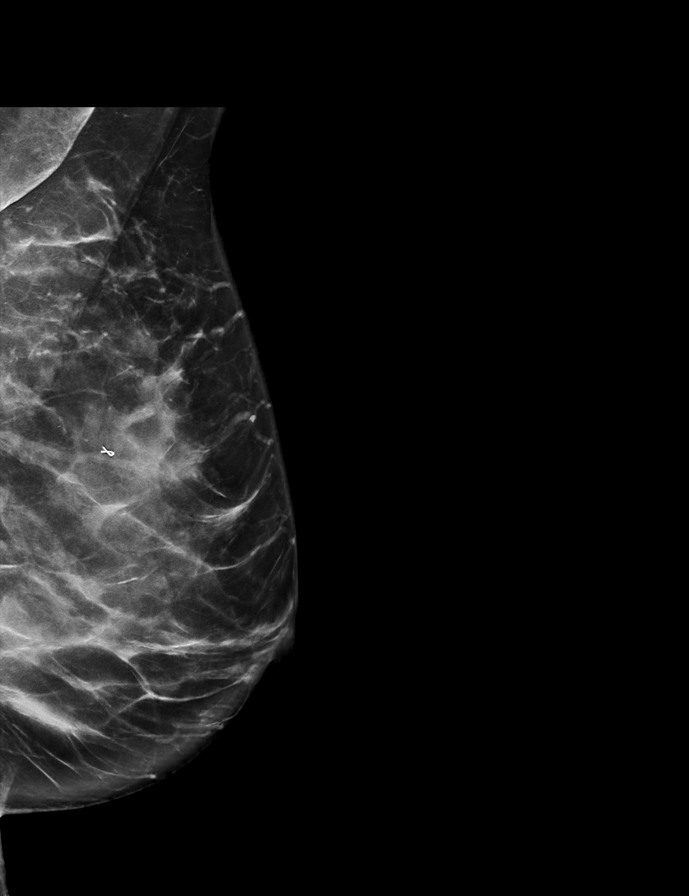

[R ML synth-2D]
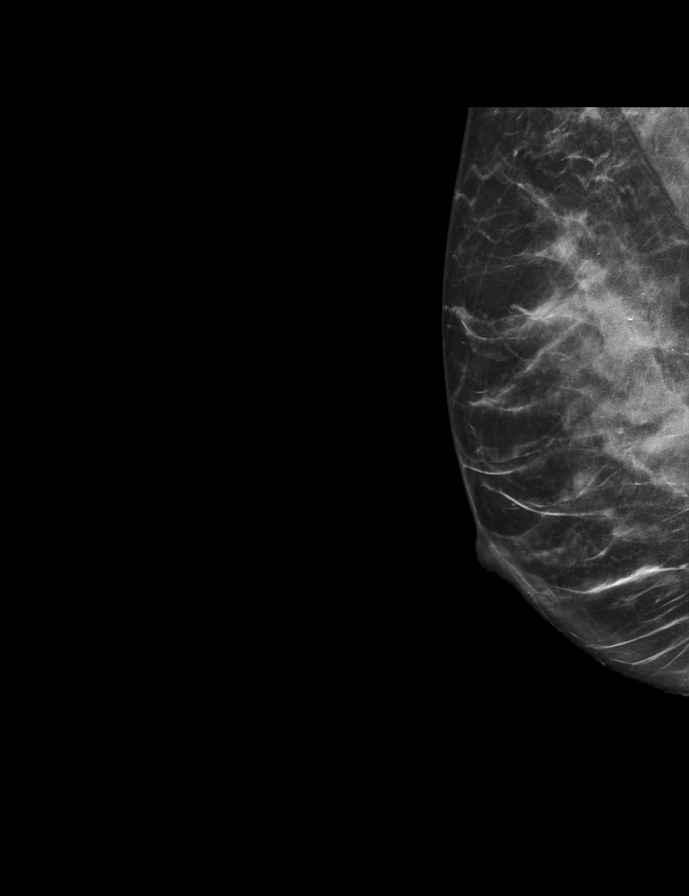

[R MLO synth-2D]
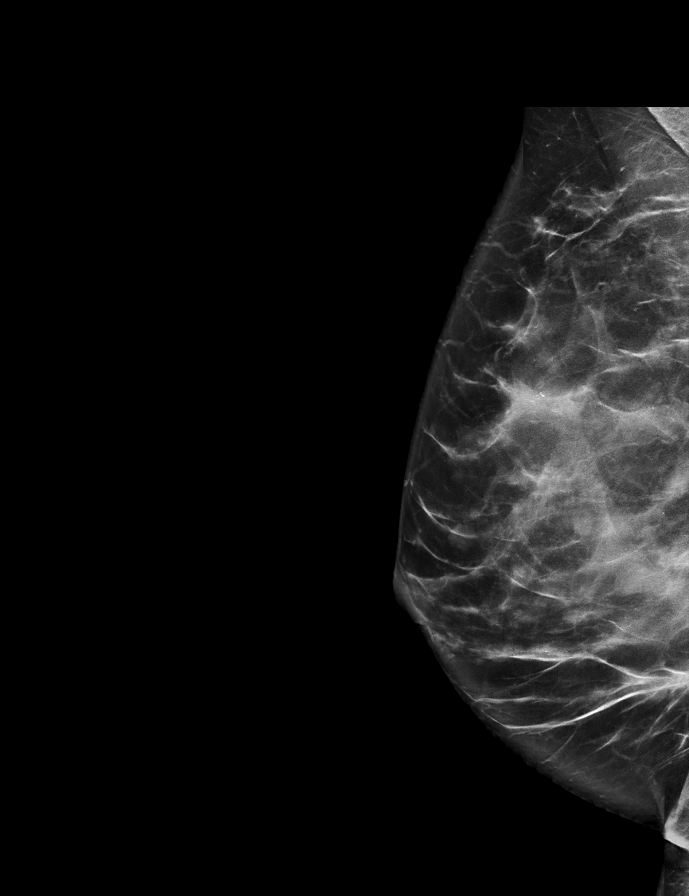

[R CC synth-2D]
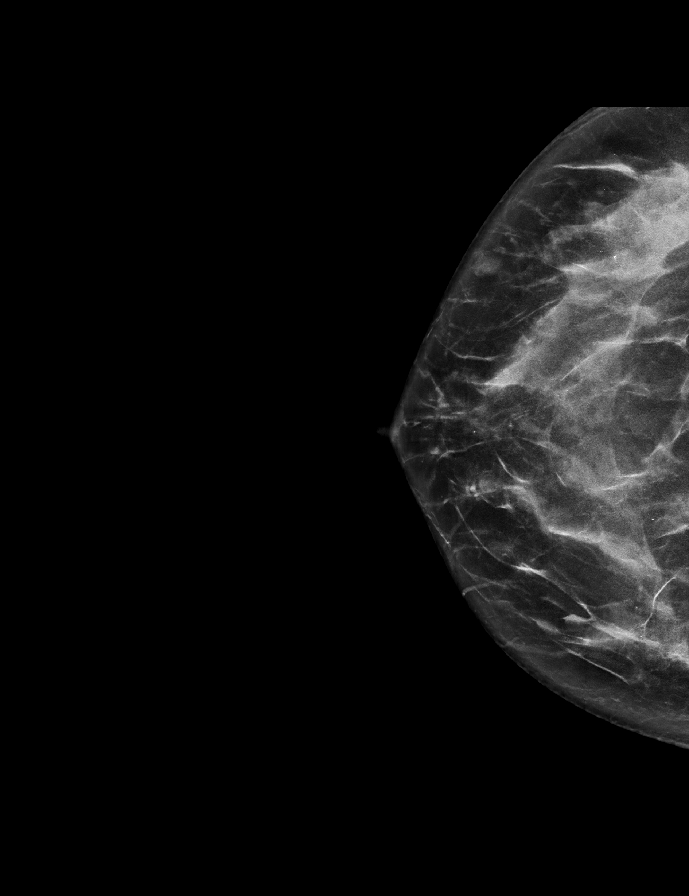

[L CC synth-2D]
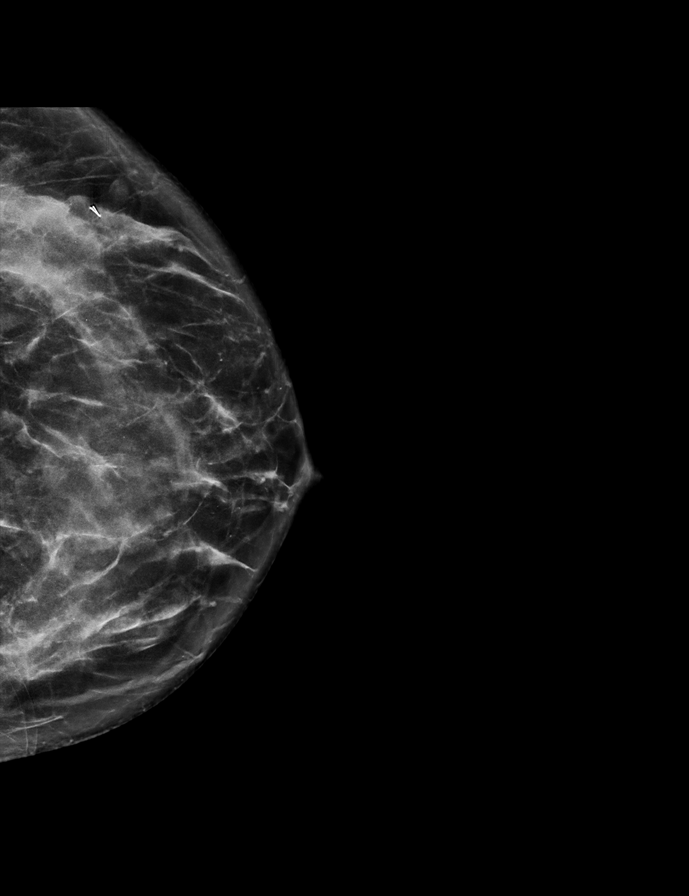

[8 of 33 positions shown; findings below may reference images not displayed]

ACR Breast Density Category c: The breast tissue is heterogeneously
dense, which may obscure small masses.
FINDINGS: Mammogram:

Right breast: Spot 2D magnification views of the right breast were
performed in addition to standard views. There are stable loosely
grouped calcifications in the upper outer right breast, some of
which layer on true lateral view consistent with benign milk of
calcium. No new suspicious linear or branching forms. There is a
stable oval mass within the outer right breast anterior depth. No
new suspicious findings identified elsewhere in the right breast.

Left breast: There is a stable oval mass in the outer left breast
middle depth. There is a biopsy marking clip in the upper outer left
breast at the site of biopsy proven papilloma. There are no new
suspicious findings elsewhere in the left breast on today's imaging.

Ultrasound:

Right breast: Targeted ultrasound performed in the right breast at 9
o'clock 2 cm from the nipple demonstrating an oval circumscribed
hypoechoic mass measuring 0.7 x 0.4 x 0.6 cm, previously measuring
0.7 x 0.4 x 0.6 cm. At 9 o'clock 3 cm from the nipple there is oval
circumscribed hypoechoic mass measuring 0.5 x 0.4 x 0.6 cm,
previously measuring 0.6 x 0.5 x 0.5 cm.

Left breast: Targeted ultrasound performed in the left breast at 5
o'clock 5 cm from the nipple demonstrating an oval circumscribed
hypoechoic mass measuring 0.6 x 0.3 x 0.6 cm, previously measuring
0.6 x 0.4 x 0.7 cm.

At the site of biopsy-proven papilloma at 3 o'clock 5 cm from the
nipple there is an oval circumscribed hypoechoic mass measuring
x 0.5 x 0.7 cm, previously measuring 1.1 x 0.5 x 0.9 cm.
IMPRESSION: 1. Stable probably benign right breast calcifications. These have
been stable for 1 year.

2. Stable probably benign right breast masses. These have been
stable for 1 year.

3. Stable probably benign left breast masses, 1 of which has been
biopsy demonstrating intraductal papilloma. The mass at 5 o'clock
has been stable for 1 year. The biopsy proven papilloma has been
stable for 6 months.

RECOMMENDATION:
Diagnostic left breast mammogram and ultrasound in 6 months for
surveillance of the biopsy proven probably benign left breast
papilloma.

I have discussed the findings and recommendations with the patient.
If applicable, a reminder letter will be sent to the patient
regarding the next appointment.

BI-RADS CATEGORY  3: Probably benign.

## 2023-08-06 ENCOUNTER — Ambulatory Visit (INDEPENDENT_AMBULATORY_CARE_PROVIDER_SITE_OTHER): Payer: Self-pay

## 2023-08-06 VITALS — Ht 67.0 in | Wt 169.0 lb

## 2023-08-06 DIAGNOSIS — Z23 Encounter for immunization: Secondary | ICD-10-CM

## 2023-08-10 ENCOUNTER — Encounter: Payer: Self-pay | Admitting: Nurse Practitioner

## 2023-08-10 ENCOUNTER — Ambulatory Visit (INDEPENDENT_AMBULATORY_CARE_PROVIDER_SITE_OTHER): Payer: 59 | Admitting: Nurse Practitioner

## 2023-08-10 VITALS — BP 117/76 | HR 80 | Temp 97.9°F | Wt 170.4 lb

## 2023-08-10 DIAGNOSIS — Z1329 Encounter for screening for other suspected endocrine disorder: Secondary | ICD-10-CM | POA: Diagnosis not present

## 2023-08-10 DIAGNOSIS — Z13220 Encounter for screening for lipoid disorders: Secondary | ICD-10-CM | POA: Diagnosis not present

## 2023-08-10 DIAGNOSIS — M419 Scoliosis, unspecified: Secondary | ICD-10-CM

## 2023-08-10 DIAGNOSIS — Z0000 Encounter for general adult medical examination without abnormal findings: Secondary | ICD-10-CM

## 2023-08-10 MED ORDER — IBUPROFEN 800 MG PO TABS: 800.0000 mg | ORAL_TABLET | Freq: Three times a day (TID) | ORAL | 1 refills | Status: AC | PRN

## 2023-08-10 MED ORDER — CYCLOBENZAPRINE HCL 10 MG PO TABS: 10.0000 mg | ORAL_TABLET | Freq: Three times a day (TID) | ORAL | 5 refills | Status: AC | PRN

## 2023-08-10 NOTE — Patient Instructions (Addendum)
 1. Routine adult health maintenance (Primary)  - CBC; Future - Comprehensive metabolic panel; Future  2. Lipid screening  - Lipid Panel; Future  3. Thyroid disorder screen  - TSH; Future  4. Scoliosis of lumbosacral spine, unspecified scoliosis type  - cyclobenzaprine  (FLEXERIL ) 10 MG tablet; Take 1 tablet (10 mg total) by mouth 3 (three) times daily as needed for muscle spasms.  Dispense: 30 tablet; Refill: 5 - ibuprofen  (ADVIL ) 800 MG tablet; Take 1 tablet (800 mg total) by mouth every 8 (eight) hours as needed for moderate pain (pain score 4-6). Most last 7 days.  Dispense: 90 tablet; Refill: 1   Follow up:  Follow up in 3 months

## 2023-08-10 NOTE — Progress Notes (Signed)
 Subjective   Patient ID: Melanie Ortiz, female    DOB: 1976/04/29, 48 y.o.   MRN: [id number]  Chief Complaint  Patient presents with   Annual Exam    Referring provider: Jerrlyn Morel, NP  Melanie Ortiz is a 48 y.o. female with Past Medical History: No date: Allergic reaction No date: Anemia No date: Asthma No date: COVID No date: Facial swelling 2015: Neuromuscular disorder (HCC)     Comment:  bilateral legs and hands- spasms.    HPI  Patient presents today for physical.  Overall she has been doing well.  She does need a refill on muscle relaxer today.  She is due for Pap smear and blood work but we do not have lab available today.  Patient will return next week to have this completed. Denies f/c/s, n/v/d, hemoptysis, PND, leg swelling Denies chest pain or edema      Allergies  Allergen Reactions   Onion Swelling   Zanaflex  [Tizanidine ]     Dizziness, confusion    Immunization History  Administered Date(s) Administered   Influenza, Seasonal, Injecte, Preservative Fre 08/06/2023   Influenza,inj,Quad PF,6+ Mos 07/02/2018, 05/01/2019, 05/03/2020, 04/01/2021, 04/10/2022   Moderna SARS-COV2 Booster Vaccination 01/16/2020   Moderna Sars-Covid-2 Vaccination 03/19/2020   Tdap 07/02/2018    Tobacco History: Social History   Tobacco Use  Smoking Status Never  Smokeless Tobacco Former   Types: Snuff   Counseling given: Not Answered   Outpatient Encounter Medications as of 08/10/2023  Medication Sig   cholecalciferol (VITAMIN D3) 25 MCG (1000 UT) tablet Take 1,000 Units by mouth daily.   diclofenac  Sodium (VOLTAREN ) 1 % GEL Apply 2 g topically 4 (four) times daily.   docusate sodium  (STOOL SOFTENER) 100 MG capsule Take 1 capsule (100 mg total) by mouth 2 (two) times daily.   Homeopathic Products (THERAWORX RELIEF) FOAM Apply 1 application  topically 2 (two) times daily.   meloxicam  (MOBIC ) 7.5 MG tablet Take 1 tablet (7.5 mg total) by mouth daily.    montelukast  (SINGULAIR ) 10 MG tablet Take 1 tablet (10 mg total) by mouth at bedtime.   [DISCONTINUED] cyclobenzaprine  (FLEXERIL ) 10 MG tablet Take 1 tablet (10 mg total) by mouth 3 (three) times daily as needed for muscle spasms.   [DISCONTINUED] ibuprofen  (ADVIL ) 800 MG tablet Take 1 tablet (800 mg total) by mouth every 8 (eight) hours as needed for moderate pain. Most last 7 days.   ciclopirox  (PENLAC ) 8 % solution Apply topically at bedtime. Apply over nail and surrounding skin. Apply daily over previous coat. After seven (7) days, may remove with alcohol and continue cycle. (Patient not taking: Reported on 08/10/2023)   cyclobenzaprine  (FLEXERIL ) 10 MG tablet Take 1 tablet (10 mg total) by mouth 3 (three) times daily as needed for muscle spasms.   doxycycline  (VIBRA -TABS) 100 MG tablet Take 1 tablet (100 mg total) by mouth 2 (two) times daily. (Patient not taking: Reported on 08/10/2023)   ibuprofen  (ADVIL ) 800 MG tablet Take 1 tablet (800 mg total) by mouth every 8 (eight) hours as needed for moderate pain (pain score 4-6). Most last 7 days.   lidocaine  (HM LIDOCAINE  PATCH) 4 % Place 1 patch onto the skin daily. (Patient not taking: Reported on 08/10/2023)   predniSONE  (DELTASONE ) 10 MG tablet Take 4 tabs for 2 days, then 3 tabs for 2 days, then 2 tabs for 2 days, then 1 tab for 2 days, then stop (Patient not taking: Reported on 08/10/2023)   predniSONE  (STERAPRED  UNI-PAK 21 TAB) 10 MG (21) TBPK tablet Take as directed (Patient not taking: Reported on 08/10/2023)   traMADol  (ULTRAM ) 50 MG tablet Take 1 tablet (50 mg total) by mouth every 12 (twelve) hours as needed. (Patient not taking: Reported on 08/10/2023)   No facility-administered encounter medications on file as of 08/10/2023.    Review of Systems  Review of Systems  Constitutional: Negative.   HENT: Negative.    Cardiovascular: Negative.   Gastrointestinal: Negative.   Allergic/Immunologic: Negative.   Neurological: Negative.    Psychiatric/Behavioral: Negative.       Objective:   BP 117/76   Pulse 80   Temp 97.9 F (36.6 C)   Wt 170 lb 6.4 oz (77.3 kg)   SpO2 100%   BMI 26.69 kg/m   Wt Readings from Last 5 Encounters:  08/10/23 170 lb 6.4 oz (77.3 kg)  08/06/23 169 lb (76.7 kg)  10/10/22 161 lb 6.4 oz (73.2 kg)  05/02/22 172 lb (78 kg)  04/10/22 171 lb 9.6 oz (77.8 kg)     Physical Exam Vitals and nursing note reviewed.  Constitutional:      General: She is not in acute distress.    Appearance: She is well-developed.  Cardiovascular:     Rate and Rhythm: Normal rate and regular rhythm.  Pulmonary:     Effort: Pulmonary effort is normal.     Breath sounds: Normal breath sounds.  Neurological:     Mental Status: She is alert and oriented to person, place, and time.       Assessment & Plan:   Routine adult health maintenance -     CBC; Future -     Comprehensive metabolic panel; Future  Lipid screening -     Lipid panel; Future  Thyroid disorder screen -     TSH; Future  Scoliosis of lumbosacral spine, unspecified scoliosis type -     Cyclobenzaprine  HCl; Take 1 tablet (10 mg total) by mouth 3 (three) times daily as needed for muscle spasms.  Dispense: 30 tablet; Refill: 5 -     Ibuprofen ; Take 1 tablet (800 mg total) by mouth every 8 (eight) hours as needed for moderate pain (pain score 4-6). Most last 7 days.  Dispense: 90 tablet; Refill: 1     Return in about 1 year (around 08/09/2024) for Physical.     Jerrlyn Morel, NP 08/10/2023

## 2023-08-17 ENCOUNTER — Other Ambulatory Visit: Payer: 59

## 2023-08-17 DIAGNOSIS — Z0000 Encounter for general adult medical examination without abnormal findings: Secondary | ICD-10-CM

## 2023-08-17 DIAGNOSIS — Z13220 Encounter for screening for lipoid disorders: Secondary | ICD-10-CM

## 2023-08-17 DIAGNOSIS — Z1329 Encounter for screening for other suspected endocrine disorder: Secondary | ICD-10-CM

## 2023-08-18 LAB — COMPREHENSIVE METABOLIC PANEL
ALT: 12 [IU]/L (ref 0–32)
AST: 17 [IU]/L (ref 0–40)
Albumin: 4.4 g/dL (ref 3.9–4.9)
Alkaline Phosphatase: 49 [IU]/L (ref 44–121)
BUN/Creatinine Ratio: 30 — ABNORMAL HIGH (ref 9–23)
BUN: 21 mg/dL (ref 6–24)
Bilirubin Total: <0.2 mg/dL (ref 0.0–1.2)
CO2: 21 mmol/L (ref 20–29)
Calcium: 9.1 mg/dL (ref 8.7–10.2)
Chloride: 105 mmol/L (ref 96–106)
Creatinine, Ser: 0.71 mg/dL (ref 0.57–1.00)
Globulin, Total: 2.4 g/dL (ref 1.5–4.5)
Glucose: 109 mg/dL — ABNORMAL HIGH (ref 70–99)
Potassium: 3.6 mmol/L (ref 3.5–5.2)
Sodium: 141 mmol/L (ref 134–144)
Total Protein: 6.8 g/dL (ref 6.0–8.5)
eGFR: 105 mL/min/{1.73_m2} (ref >59)

## 2023-08-18 LAB — CBC
Hematocrit: 32.1 % — ABNORMAL LOW (ref 34.0–46.6)
Hemoglobin: 10.4 g/dL — ABNORMAL LOW (ref 11.1–15.9)
MCH: 27.1 pg (ref 26.6–33.0)
MCHC: 32.4 g/dL (ref 31.5–35.7)
MCV: 84 fL (ref 79–97)
Platelets: 299 10*3/uL (ref 150–450)
RBC: 3.84 x10E6/uL (ref 3.77–5.28)
RDW: 12.9 % (ref 11.7–15.4)
WBC: 4.5 10*3/uL (ref 3.4–10.8)

## 2023-08-18 LAB — LIPID PANEL
Chol/HDL Ratio: 1.9 {ratio} (ref 0.0–4.4)
Cholesterol, Total: 227 mg/dL — ABNORMAL HIGH (ref 100–199)
HDL: 118 mg/dL (ref >39)
LDL Chol Calc (NIH): 95 mg/dL (ref 0–99)
Triglycerides: 83 mg/dL (ref 0–149)
VLDL Cholesterol Cal: 14 mg/dL (ref 5–40)

## 2023-08-18 LAB — TSH: TSH: 1.31 u[IU]/mL (ref 0.450–4.500)

## 2023-09-11 IMAGING — CR DG HIP (WITH OR WITHOUT PELVIS) 2-3V*L*
3 series · 3 of 3 positions shown · non-contrast
Comparison: None Available.

CLINICAL DATA: Left hip pain

EXAM:
DG HIP (WITH OR WITHOUT PELVIS) 2-3V LEFT

[t pelvis a.p.]
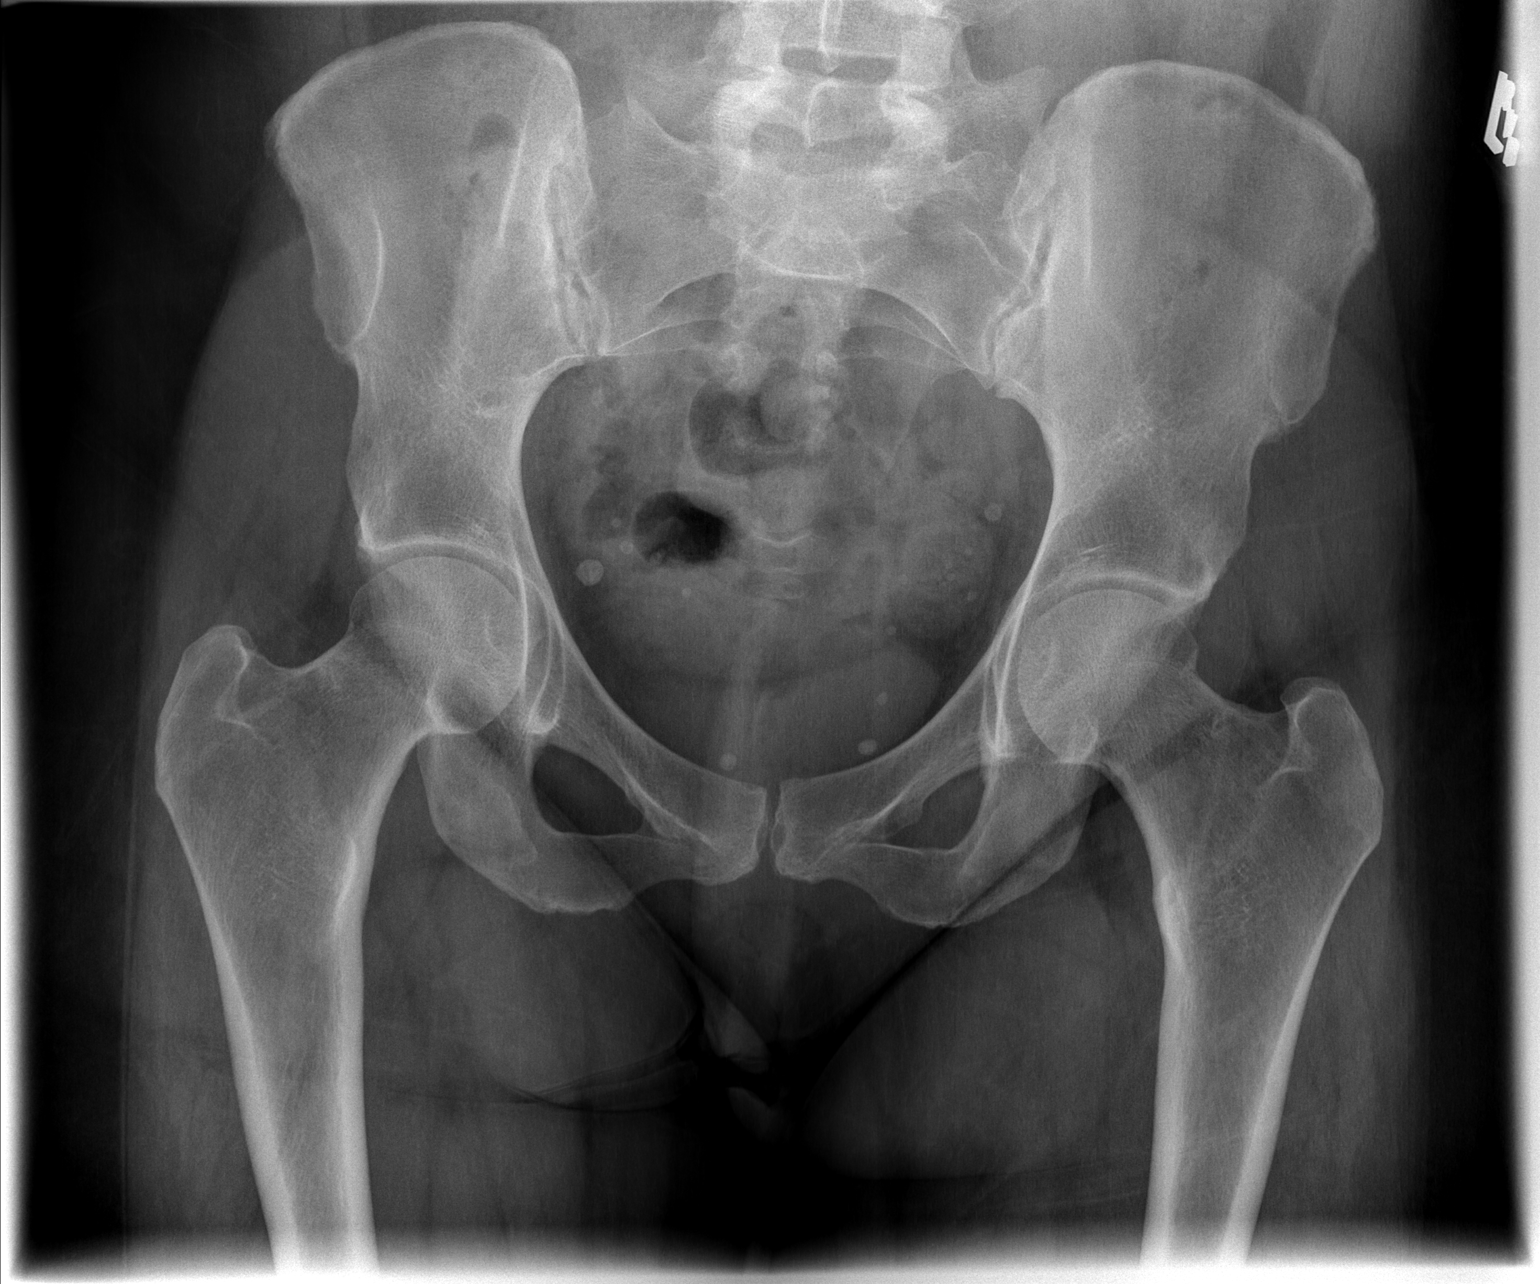

[t hip ap left]
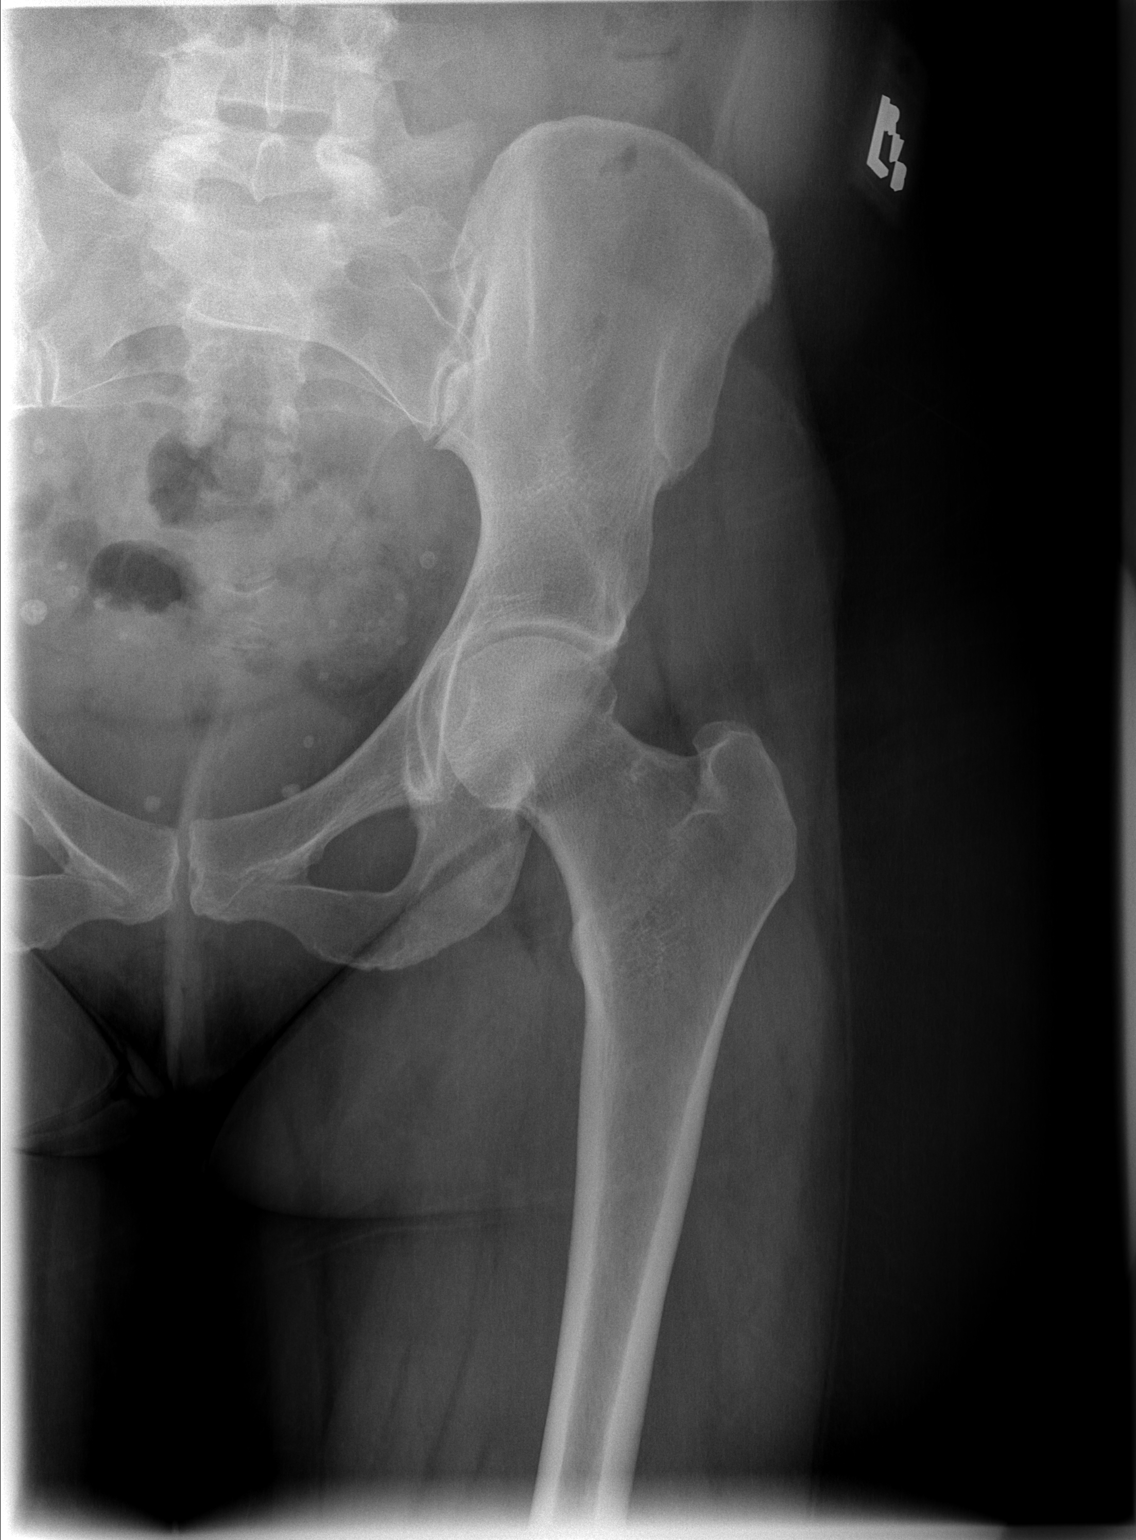

[t hip frog leg left]
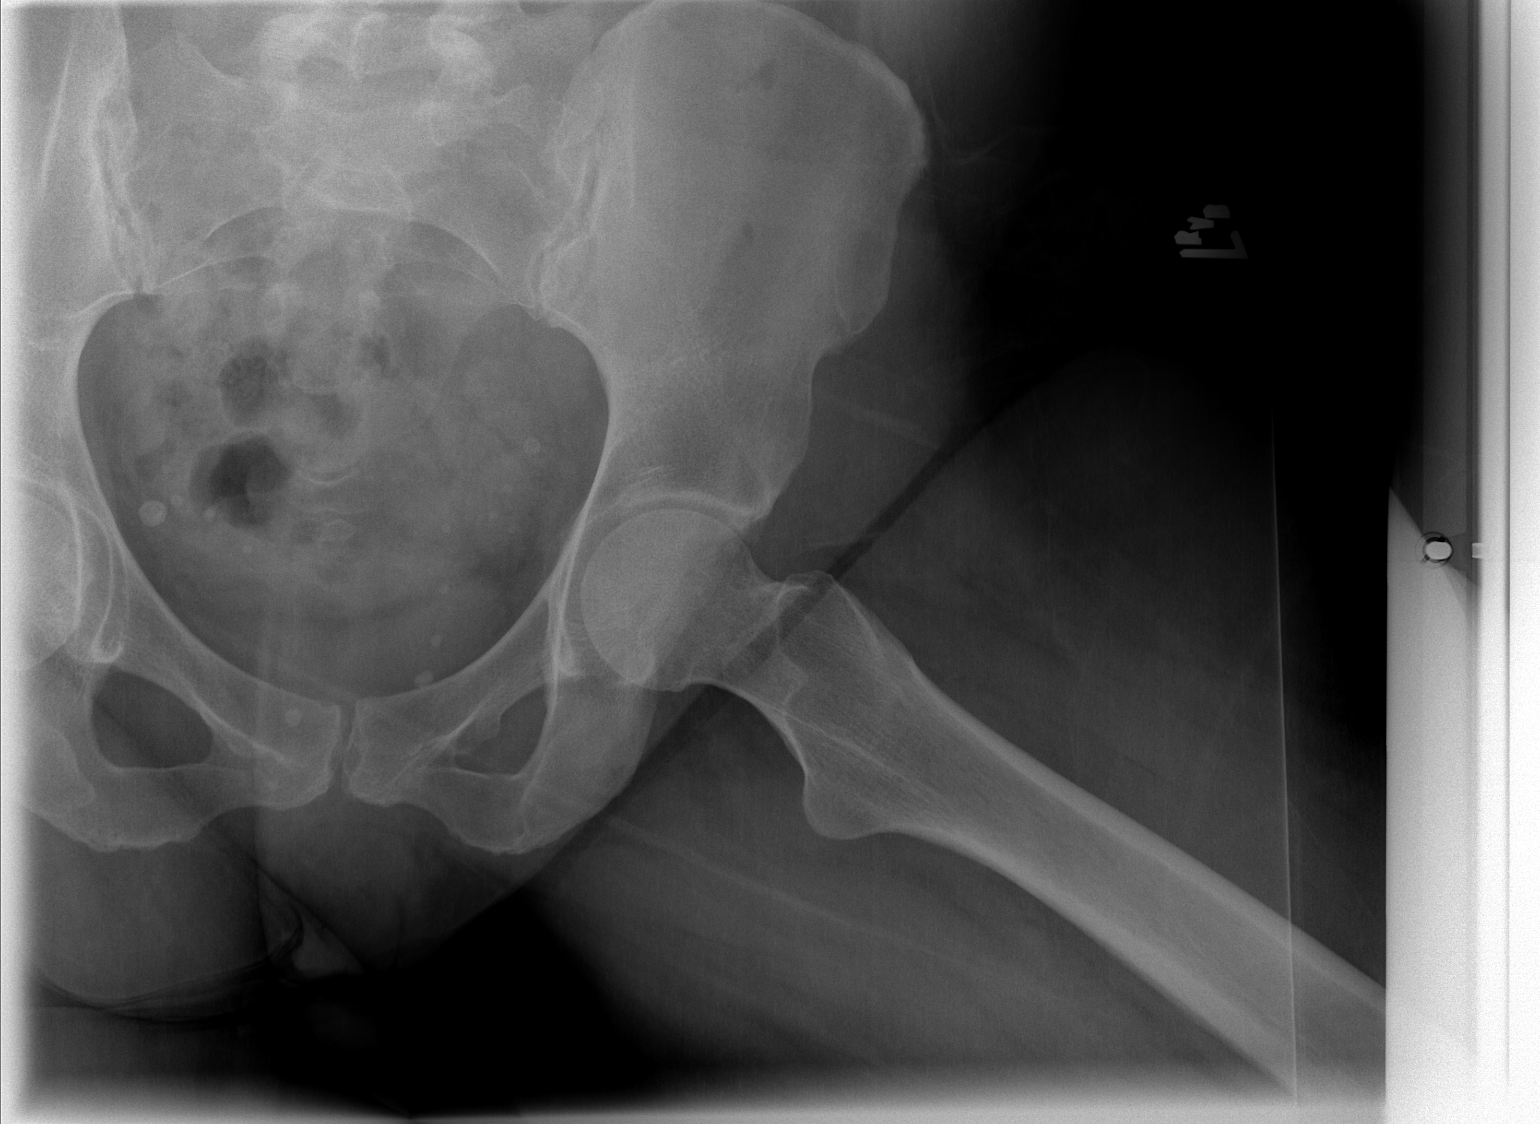

[3 of 3 positions shown; findings below may reference images not displayed]

FINDINGS: There is no evidence of hip fracture or dislocation. There is no
evidence of arthropathy or other focal bone abnormality.
IMPRESSION: No acute osseous abnormality identified.

## 2023-09-18 ENCOUNTER — Encounter: Payer: Self-pay | Admitting: Nurse Practitioner

## 2023-10-19 ENCOUNTER — Encounter: Payer: Self-pay | Admitting: Nurse Practitioner

## 2023-11-02 ENCOUNTER — Other Ambulatory Visit: Payer: Self-pay | Admitting: Nurse Practitioner

## 2023-11-02 DIAGNOSIS — Z1231 Encounter for screening mammogram for malignant neoplasm of breast: Secondary | ICD-10-CM

## 2023-11-03 ENCOUNTER — Encounter: Payer: Self-pay | Admitting: Nurse Practitioner

## 2023-11-03 ENCOUNTER — Other Ambulatory Visit (HOSPITAL_COMMUNITY)
Admission: RE | Admit: 2023-11-03 | Discharge: 2023-11-03 | Disposition: A | Discharge status: Home / Self Care | Source: Ambulatory Visit | Attending: Nurse Practitioner | Admitting: Nurse Practitioner

## 2023-11-03 ENCOUNTER — Ambulatory Visit (INDEPENDENT_AMBULATORY_CARE_PROVIDER_SITE_OTHER): Payer: Self-pay | Admitting: Nurse Practitioner

## 2023-11-03 VITALS — BP 121/69 | HR 75 | Temp 98.4°F | Wt 166.4 lb

## 2023-11-03 DIAGNOSIS — Z124 Encounter for screening for malignant neoplasm of cervix: Secondary | ICD-10-CM

## 2023-11-03 MED ORDER — TRIAMCINOLONE ACETONIDE 0.5 % EX OINT: 1.0000 | TOPICAL_OINTMENT | Freq: Two times a day (BID) | CUTANEOUS | 0 refills | Status: AC

## 2023-11-03 NOTE — Patient Instructions (Signed)
Pap Test Why am I having this test? A Pap test, also called a Pap smear, is a screening test to check for signs of: Infection. Cancer of the cervix. The cervix is the lower part of the uterus that opens into the vagina. Changes that may be a sign that cancer is developing (precancerous changes). Women need this test on a regular basis. In general, you should have a Pap test every 3 years until you reach menopause or age 49. Women aged 30-60 may choose to have their Pap test done at the same time as an HPV (human papillomavirus) test every 5 years (instead of every 3 years). Your health care provider may recommend having Pap tests more or less often depending on your medical conditions and past Pap test results. What is being tested? Cervical cells are tested for signs of infection or abnormalities. What kind of sample is taken?  Your health care provider will collect a sample of cells from the surface of your cervix. This will be done using a small cotton swab, plastic spatula, or brush that is inserted into your vagina using a tool called a speculum. This sample is often collected during a pelvic exam, when you are lying on your back on an exam table with your feet in footrests (stirrups). In some cases, fluids (secretions) from the cervix or vagina may also be collected. How do I prepare for this test? Be aware of where you are in your menstrual cycle. If you are menstruating on the day of the test, you may be asked to reschedule. You may need to reschedule if you have a known vaginal infection on the day of the test. Follow instructions from your health care provider about: Changing or stopping your regular medicines. Some medicines can cause abnormal test results, such as vaginal medicines and tetracycline. Avoiding douching 2-3 days before or the day of the test. Tell a health care provider about: Any allergies you have. All medicines you are taking, including vitamins, herbs, eye drops,  creams, and over-the-counter medicines. Any bleeding problems you have. Any surgeries you have had. Any medical conditions you have. Whether you are pregnant or may be pregnant. How are the results reported? Your test results will be reported as either abnormal or normal. What do the results mean? A normal test result means that you do not have signs of cancer of the cervix. An abnormal result may mean that you have: Cancer. A Pap test by itself is not enough to diagnose cancer. You will have more tests done if cancer is suspected. Precancerous changes in your cervix. Inflammation of the cervix. An STI (sexually transmitted infection). A fungal infection. A parasite infection. Talk with your health care provider about what your results mean. In some cases, your health care provider may do more testing to confirm the results. Questions to ask your health care provider Ask your health care provider, or the department that is doing the test: When will my results be ready? How will I get my results? What are my treatment options? What other tests do I need? What are my next steps? Summary In general, women should have a Pap test every 3 years until they reach menopause or age 49. Your health care provider will collect a sample of cells from the surface of your cervix. This will be done using a small cotton swab, plastic spatula, or brush. In some cases, fluids (secretions) from the cervix or vagina may also be collected. This information is not   intended to replace advice given to you by your health care provider. Make sure you discuss any questions you have with your health care provider. Document Revised: 09/14/2020 Document Reviewed: 09/14/2020 Elsevier Patient Education  2024 [employer]  

## 2023-11-03 NOTE — Progress Notes (Signed)
 Subjective   Patient ID: Melanie Ortiz, female    DOB: 03-16-1976, 48 y.o.   MRN: [id number]  Chief Complaint  Patient presents with   Annual Exam    Referring provider: Jerrlyn Morel, NP  Melanie Ortiz is a 48 y.o. female with Past Medical History: No date: Allergic reaction No date: Anemia No date: Asthma No date: COVID No date: Facial swelling 2015: Neuromuscular disorder (HCC)     Comment:  bilateral legs and hands- spasms.    HPI  Patient presents today for a Pap smear.  She also states that she has been having a rash to her bilateral arms since the weather has been warmer.  We will order Kenalog cream.  Patient does have mammogram scheduled. Denies f/c/s, n/v/d, hemoptysis, PND, leg swelling Denies chest pain or edema    Allergies  Allergen Reactions   Onion Swelling   Zanaflex  [Tizanidine ]     Dizziness, confusion    Immunization History  Administered Date(s) Administered   Influenza, Seasonal, Injecte, Preservative Fre 08/06/2023   Influenza,inj,Quad PF,6+ Mos 07/02/2018, 05/01/2019, 05/03/2020, 04/01/2021, 04/10/2022   Moderna SARS-COV2 Booster Vaccination 01/16/2020   Moderna Sars-Covid-2 Vaccination 03/19/2020   Tdap 07/02/2018    Tobacco History: Social History   Tobacco Use  Smoking Status Never  Smokeless Tobacco Former   Types: Snuff   Counseling given: Not Answered   Outpatient Encounter Medications as of 11/03/2023  Medication Sig   cholecalciferol (VITAMIN D3) 25 MCG (1000 UT) tablet Take 1,000 Units by mouth daily.   cyclobenzaprine  (FLEXERIL ) 10 MG tablet Take 1 tablet (10 mg total) by mouth 3 (three) times daily as needed for muscle spasms.   docusate sodium  (STOOL SOFTENER) 100 MG capsule Take 1 capsule (100 mg total) by mouth 2 (two) times daily.   Homeopathic Products (THERAWORX RELIEF) FOAM Apply 1 application  topically 2 (two) times daily.   ibuprofen  (ADVIL ) 800 MG tablet Take 1 tablet (800 mg total) by mouth every 8  (eight) hours as needed for moderate pain (pain score 4-6). Most last 7 days.   triamcinolone ointment (KENALOG) 0.5 % Apply 1 Application topically 2 (two) times daily.   ciclopirox  (PENLAC ) 8 % solution Apply topically at bedtime. Apply over nail and surrounding skin. Apply daily over previous coat. After seven (7) days, may remove with alcohol and continue cycle. (Patient not taking: Reported on 10/10/2022)   diclofenac  Sodium (VOLTAREN ) 1 % GEL Apply 2 g topically 4 (four) times daily. (Patient not taking: Reported on 11/03/2023)   doxycycline  (VIBRA -TABS) 100 MG tablet Take 1 tablet (100 mg total) by mouth 2 (two) times daily. (Patient not taking: Reported on 08/10/2023)   lidocaine  (HM LIDOCAINE  PATCH) 4 % Place 1 patch onto the skin daily. (Patient not taking: Reported on 08/10/2023)   meloxicam  (MOBIC ) 7.5 MG tablet Take 1 tablet (7.5 mg total) by mouth daily. (Patient not taking: Reported on 11/03/2023)   montelukast  (SINGULAIR ) 10 MG tablet Take 1 tablet (10 mg total) by mouth at bedtime.   predniSONE  (DELTASONE ) 10 MG tablet Take 4 tabs for 2 days, then 3 tabs for 2 days, then 2 tabs for 2 days, then 1 tab for 2 days, then stop (Patient not taking: Reported on 08/10/2023)   predniSONE  (STERAPRED UNI-PAK 21 TAB) 10 MG (21) TBPK tablet Take as directed (Patient not taking: Reported on 08/10/2023)   traMADol  (ULTRAM ) 50 MG tablet Take 1 tablet (50 mg total) by mouth every 12 (twelve) hours as needed. (Patient  not taking: Reported on 08/10/2023)   No facility-administered encounter medications on file as of 11/03/2023.    Review of Systems  Review of Systems  Constitutional: Negative.   HENT: Negative.    Cardiovascular: Negative.   Gastrointestinal: Negative.   Allergic/Immunologic: Negative.   Neurological: Negative.   Psychiatric/Behavioral: Negative.       Objective:   BP 121/69   Pulse 75   Temp 98.4 F (36.9 C) (Oral)   Wt 166 lb 6.4 oz (75.5 kg)   SpO2 99%   BMI 26.06 kg/m    Wt Readings from Last 5 Encounters:  11/03/23 166 lb 6.4 oz (75.5 kg)  08/10/23 170 lb 6.4 oz (77.3 kg)  08/06/23 169 lb (76.7 kg)  10/10/22 161 lb 6.4 oz (73.2 kg)  05/02/22 172 lb (78 kg)     Physical Exam Vitals and nursing note reviewed. Exam conducted with a chaperone present.  Constitutional:      General: She is not in acute distress.    Appearance: She is well-developed.  Cardiovascular:     Rate and Rhythm: Normal rate and regular rhythm.  Pulmonary:     Effort: Pulmonary effort is normal.     Breath sounds: Normal breath sounds.  Genitourinary:    General: Normal vulva.     Vagina: Normal.     Cervix: Normal.  Neurological:     Mental Status: She is alert and oriented to person, place, and time.       Assessment & Plan:   Cervical cancer screening -     Cytology - PAP -     Triamcinolone Acetonide; Apply 1 Application topically 2 (two) times daily.  Dispense: 30 g; Refill: 0     Return in about 1 year (around 11/02/2024) for Physical.   Jerrlyn Morel, NP 11/03/2023

## 2023-11-10 LAB — CYTOLOGY - PAP
Adequacy: ABSENT
Comment: NEGATIVE
Diagnosis: NEGATIVE
High risk HPV: NEGATIVE

## 2023-11-11 ENCOUNTER — Ambulatory Visit: Payer: Self-pay | Admitting: Nurse Practitioner

## 2023-11-12 ENCOUNTER — Telehealth: Payer: Self-pay | Admitting: Nurse Practitioner

## 2023-11-12 NOTE — Telephone Encounter (Signed)
 Copied from CRM [id number]. Topic: General - Other >> Nov 10, 2023  5:44 PM Kevelyn M wrote: Reason for CRM: Patient called in attempting fix discrepancy on her insurance card.

## 2023-11-20 ENCOUNTER — Ambulatory Visit
Admission: RE | Admit: 2023-11-20 | Discharge: 2023-11-20 | Disposition: A | Discharge status: Home / Self Care | Source: Ambulatory Visit | Attending: Nurse Practitioner | Admitting: Nurse Practitioner

## 2023-11-20 ENCOUNTER — Ambulatory Visit
Admission: EM | Admit: 2023-11-20 | Discharge: 2023-11-20 | Disposition: A | Discharge status: Home / Self Care | Attending: Family Medicine | Admitting: Family Medicine

## 2023-11-20 ENCOUNTER — Ambulatory Visit: Payer: Self-pay

## 2023-11-20 DIAGNOSIS — Z1231 Encounter for screening mammogram for malignant neoplasm of breast: Secondary | ICD-10-CM

## 2023-11-20 DIAGNOSIS — L03213 Periorbital cellulitis: Secondary | ICD-10-CM

## 2023-11-20 DIAGNOSIS — J309 Allergic rhinitis, unspecified: Secondary | ICD-10-CM

## 2023-11-20 MED ORDER — AMOXICILLIN-POT CLAVULANATE 875-125 MG PO TABS: 1.0000 | ORAL_TABLET | Freq: Two times a day (BID) | ORAL | 0 refills | Status: AC

## 2023-11-20 MED ORDER — PSEUDOEPHEDRINE HCL 30 MG PO TABS: 30.0000 mg | ORAL_TABLET | Freq: Three times a day (TID) | ORAL | 0 refills | Status: AC | PRN

## 2023-11-20 MED ORDER — CETIRIZINE HCL 10 MG PO TABS: 10.0000 mg | ORAL_TABLET | Freq: Every day | ORAL | 0 refills | Status: AC

## 2023-11-20 NOTE — Telephone Encounter (Signed)
 Chief Complaint: eye pain Symptoms: swelling, pain Frequency: yesterday Pertinent Negatives: Patient denies fever, difficulty breathing or SOB, CP, discharge, redness Disposition: [] ED /[x] Urgent Care (no appt availability in office) / [] Appointment(In office/virtual)/ []  [facility]/ [] Home Care/ [] Refused Recommended Disposition /[] [facility]/ []  Follow-up with PCP Additional Notes: Pt reports 4/10 pain to her L eye. Pt states yesterday she felt irritation in the corner of her eye, and when she woke up this AM her eyelid was swollen and the irritation and pain had worsened. Pt states at this time the swelling has resolved, she she is still having discomfort. Pt denies redness, drainage, pus, visual changes, fever. Pt does endorse a mild H/A to her L side. Pt mentions she experienced a sharp pain in her chest recently, but none today. Pt went to [facility] on [address] today and states there was a line. RN advised pt she should be seen within 24 hours and gave the pt the wait time and the address for another UC near her location. Pt states she will go and is agreeable to that plan.      Copied from CRM [id number]. Topic: Clinical - Red Word Triage >> Nov 20, 2023  3:00 PM Carla L wrote: Red Word that prompted transfer to Nurse Triage:  Left eye feels heavy, wakes up in the morning its swollen and some pain.  Pt did go to UC but patient left due to how long they took. Reason for Disposition  Eyelid is red and painful (or tender to touch)  Answer Assessment - Initial Assessment Questions 1. ONSET: "When did the swelling start?" (e.g., minutes, hours, days)     Started yesterday, felt like something was inside her eye and the corner of her eye was sore, then this AM her eye was swollen and like "something was rolling around in it", aching soreness pain -- pt states swelling is gone 2. LOCATION: "What part of the eyelids is swollen?"     "It looks normal now but it's still  sore" 3. SEVERITY: "How swollen is it?"     Pt states the swelling was worse this AM and the swelling has improved. 4. ITCHING: "Is there any itching?" If Yes, ask: "How much?"   (Scale 1-10; mild, moderate or severe)     "Somewhat itchy" 5. PAIN: "Is the swelling painful to touch?" If Yes, ask: "How painful is it?"   (Scale 1-10; mild, moderate or severe)     4/10 6. FEVER: "Do you have a fever?" If Yes, ask: "What is it, how was it measured, and when did it start?"      Denies fever or chills 7. CAUSE: "What do you think is causing the swelling?"     Not sure  8. RECURRENT SYMPTOM: "Have you had eyelid swelling before?" If Yes, ask: "When was the last time?" "What happened that time?"     "I did one time but that came from eyelashes and raw onion" 9. OTHER SYMPTOMS: "Do you have any other symptoms?" (e.g., blurred vision, eye discharge, rash, runny nose)     Denies pus or discharge. Denies visual changes. "Mild headache on that side." Denies redness to the eye. Denies flu or respiratory-like symptoms. Pt endorses hx of sinusitis but none today. No rashes. Denies CP or SOB. "Every now and then I get a sharp pain in my chest." Denies difficulty swallowing  Protocols used: Eye - Swelling-A-AH

## 2023-11-20 NOTE — ED Triage Notes (Signed)
 Patient reports left eye irritation and fluid build up in her eye. Denies any trauma to her left eye.

## 2023-11-20 NOTE — ED Provider Notes (Signed)
 [facility]  Note:  This document was prepared using Conservation officer, historic buildings and may include unintentional dictation errors.  MRN: [id number] DOB: 12/27/1975  Subjective:   Melanie Ortiz is a 48 y.o. female presenting for 34-day history of left-sided pressure behind her eye, left upper eyelid pain and discomfort.  Has also had acute on chronic sinus congestion and pressure.  No fever, confusion, weakness, vision change, cough, chest pain, shortness of breath, nausea, vomiting, abdominal pain, numbness or tingling.  Patient does take montelukast  for allergic rhinitis.  No current facility-administered medications for this encounter.  Current Outpatient Medications:    cholecalciferol (VITAMIN D3) 25 MCG (1000 UT) tablet, Take 1,000 Units by mouth daily., Disp: , Rfl:    ciclopirox  (PENLAC ) 8 % solution, Apply topically at bedtime. Apply over nail and surrounding skin. Apply daily over previous coat. After seven (7) days, may remove with alcohol and continue cycle. (Patient not taking: Reported on 10/10/2022), Disp: 6.6 mL, Rfl: 0   cyclobenzaprine  (FLEXERIL ) 10 MG tablet, Take 1 tablet (10 mg total) by mouth 3 (three) times daily as needed for muscle spasms., Disp: 30 tablet, Rfl: 5   diclofenac  Sodium (VOLTAREN ) 1 % GEL, Apply 2 g topically 4 (four) times daily. (Patient not taking: Reported on 11/03/2023), Disp: 2 g, Rfl: 0   docusate sodium  (STOOL SOFTENER) 100 MG capsule, Take 1 capsule (100 mg total) by mouth 2 (two) times daily., Disp: 10 capsule, Rfl: 0   doxycycline  (VIBRA -TABS) 100 MG tablet, Take 1 tablet (100 mg total) by mouth 2 (two) times daily. (Patient not taking: Reported on 08/10/2023), Disp: 20 tablet, Rfl: 1   Homeopathic Products (THERAWORX RELIEF) FOAM, Apply 1 application  topically 2 (two) times daily., Disp: 210 mL, Rfl: 2   ibuprofen  (ADVIL ) 800 MG tablet, Take 1 tablet (800 mg total) by mouth every 8 (eight) hours as needed for moderate  pain (pain score 4-6). Most last 7 days., Disp: 90 tablet, Rfl: 1   lidocaine  (HM LIDOCAINE  PATCH) 4 %, Place 1 patch onto the skin daily. (Patient not taking: Reported on 08/10/2023), Disp: 5 patch, Rfl: 0   meloxicam  (MOBIC ) 7.5 MG tablet, Take 1 tablet (7.5 mg total) by mouth daily. (Patient not taking: Reported on 11/03/2023), Disp: 30 tablet, Rfl: 0   montelukast  (SINGULAIR ) 10 MG tablet, Take 1 tablet (10 mg total) by mouth at bedtime., Disp: 90 tablet, Rfl: 1   predniSONE  (DELTASONE ) 10 MG tablet, Take 4 tabs for 2 days, then 3 tabs for 2 days, then 2 tabs for 2 days, then 1 tab for 2 days, then stop (Patient not taking: Reported on 10/10/2022), Disp: 20 tablet, Rfl: 0   predniSONE  (STERAPRED UNI-PAK 21 TAB) 10 MG (21) TBPK tablet, Take as directed (Patient not taking: Reported on 10/10/2022), Disp: 21 tablet, Rfl: 0   traMADol  (ULTRAM ) 50 MG tablet, Take 1 tablet (50 mg total) by mouth every 12 (twelve) hours as needed. (Patient not taking: Reported on 10/10/2022), Disp: 30 tablet, Rfl: 2   triamcinolone  ointment (KENALOG ) 0.5 %, Apply 1 Application topically 2 (two) times daily., Disp: 30 g, Rfl: 0   Allergies  Allergen Reactions   Onion Swelling   Zanaflex  [Tizanidine ]     Dizziness, confusion    Past Medical History:  Diagnosis Date   Allergic reaction    Anemia    Asthma    COVID    Facial swelling    Neuromuscular disorder (HCC) 2015   bilateral  legs and hands- spasms.      Past Surgical History:  Procedure Laterality Date   ABDOMINAL HYSTERECTOMY     04/25/2015   BREAST BIOPSY Left 12/25/2020   CHOLECYSTECTOMY     TUBAL LIGATION      Family History  Problem Relation Age of Onset   Cancer Mother        cervical    GER disease Mother    Diabetes Brother    Ovarian cysts Daughter    ADD / ADHD Son    Breast cancer Cousin     Social History   Tobacco Use   Smoking status: Never   Smokeless tobacco: Former    Types: Snuff  Vaping Use   Vaping status: Never  Used  Substance Use Topics   Alcohol use: Not Currently    Alcohol/week: 3.0 standard drinks of alcohol    Types: 3 Cans of beer per week    Comment: everyday    Drug use: Never    ROS   Objective:   Vitals: BP 128/83 (BP Location: Left Arm)   Pulse 79   Temp 98 F (36.7 C) (Oral)   Resp 16   SpO2 98%   Physical Exam Constitutional:      General: She is not in acute distress.    Appearance: Normal appearance. She is well-developed and normal weight. She is not ill-appearing, toxic-appearing or diaphoretic.  HENT:     Head: Normocephalic and atraumatic.     Right Ear: Tympanic membrane, ear canal and external ear normal. No drainage or tenderness. No middle ear effusion. There is no impacted cerumen. Tympanic membrane is not erythematous or bulging.     Left Ear: Tympanic membrane, ear canal and external ear normal. No drainage or tenderness.  No middle ear effusion. There is no impacted cerumen. Tympanic membrane is not erythematous or bulging.     Nose: Congestion present. No rhinorrhea.     Mouth/Throat:     Mouth: Mucous membranes are moist. No oral lesions.     Pharynx: No pharyngeal swelling, oropharyngeal exudate, posterior oropharyngeal erythema or uvula swelling.     Tonsils: No tonsillar exudate or tonsillar abscesses.  Eyes:     General: No scleral icterus.       Right eye: No discharge.        Left eye: No discharge.     Extraocular Movements: Extraocular movements intact.     Right eye: Normal extraocular motion.     Left eye: Normal extraocular motion.     Conjunctiva/sclera: Conjunctivae normal.   Cardiovascular:     Rate and Rhythm: Normal rate.  Pulmonary:     Effort: Pulmonary effort is normal.  Musculoskeletal:     Cervical back: Normal range of motion and neck supple.  Lymphadenopathy:     Cervical: No cervical adenopathy.  Skin:    General: Skin is warm and dry.  Neurological:     General: No focal deficit present.     Mental Status: She is  alert and oriented to person, place, and time.     Cranial Nerves: No cranial nerve deficit.     Motor: No weakness.     Coordination: Coordination normal.     Gait: Gait normal.  Psychiatric:        Mood and Affect: Mood normal.        Behavior: Behavior normal.     Assessment and Plan :   PDMP not reviewed this encounter.  1. Preseptal cellulitis  of left upper eyelid   2. Allergic rhinitis, unspecified seasonality, unspecified trigger    Recommend covering for preseptal cellulitis with Augmentin.  Suspect this is secondary to an allergic rhinitis flare.  Maintain montelukast , add Zyrtec and pseudoephedrine.  Use supportive care otherwise.  No signs of an acute encephalopathy.  Counseled patient on potential for adverse effects with medications prescribed/recommended today, ER and return-to-clinic precautions discussed, patient verbalized understanding.    Adolph Hoop, PA-C 11/20/23 1700

## 2024-05-05 ENCOUNTER — Telehealth: Payer: Self-pay

## 2024-05-05 ENCOUNTER — Encounter: Payer: Self-pay | Admitting: Nurse Practitioner

## 2024-05-05 ENCOUNTER — Ambulatory Visit: Payer: Self-pay | Admitting: Nurse Practitioner

## 2024-05-05 VITALS — BP 120/74 | HR 70 | Wt 165.0 lb

## 2024-05-05 DIAGNOSIS — G8929 Other chronic pain: Secondary | ICD-10-CM

## 2024-05-05 DIAGNOSIS — M25512 Pain in left shoulder: Secondary | ICD-10-CM | POA: Diagnosis not present

## 2024-05-05 DIAGNOSIS — D649 Anemia, unspecified: Secondary | ICD-10-CM

## 2024-05-05 DIAGNOSIS — M7502 Adhesive capsulitis of left shoulder: Secondary | ICD-10-CM | POA: Diagnosis not present

## 2024-05-05 DIAGNOSIS — Z13220 Encounter for screening for lipoid disorders: Secondary | ICD-10-CM | POA: Diagnosis not present

## 2024-05-05 DIAGNOSIS — K5900 Constipation, unspecified: Secondary | ICD-10-CM

## 2024-05-05 MED ORDER — DICLOFENAC SODIUM 1 % EX GEL: 2.0000 g | Freq: Four times a day (QID) | CUTANEOUS | 0 refills | Status: AC

## 2024-05-05 MED ORDER — DOCUSATE SODIUM 100 MG PO CAPS: 100.0000 mg | ORAL_CAPSULE | Freq: Two times a day (BID) | ORAL | 0 refills | Status: AC

## 2024-05-05 NOTE — Progress Notes (Signed)
 Subjective   Patient ID: Melanie Ortiz, female    DOB: Sep 18, 1975, 48 y.o.   MRN: [id number]  Chief Complaint  Patient presents with   Medical Management of Chronic Issues   Bleeding/Bruising    No bleeding     Referring provider: Oley Bascom RAMAN, NP  Melanie Ortiz is a 48 y.o. female with Past Medical History: No date: Allergic reaction No date: Anemia No date: Asthma No date: COVID No date: Facial swelling 2015: Neuromuscular disorder (HCC)     Comment:  bilateral legs and hands- spasms.    HPI  Patient presents today for follow-up visit.  She does need lipids rechecked and hemoglobin rechecked today.  She has been slightly anemic in the past.  Last Cologuard check was negative. Denies f/c/s, n/v/d, hemoptysis, PND, leg swelling Denies chest pain or edema     Allergies  Allergen Reactions   Onion Swelling   Zanaflex  [Tizanidine ]     Dizziness, confusion    Immunization History  Administered Date(s) Administered   Influenza, Seasonal, Injecte, Preservative Fre 08/06/2023   Influenza,inj,Quad PF,6+ Mos 07/02/2018, 05/01/2019, 05/03/2020, 04/01/2021, 04/10/2022   Influenza-Unspecified 04/20/2024   Moderna SARS-COV2 Booster Vaccination 01/16/2020   Moderna Sars-Covid-2 Vaccination 03/19/2020   Tdap 07/02/2018    Tobacco History: Social History   Tobacco Use  Smoking Status Never  Smokeless Tobacco Former   Types: Snuff   Counseling given: Not Answered   Outpatient Encounter Medications as of 05/05/2024  Medication Sig   cetirizine  (ZYRTEC  ALLERGY) 10 MG tablet Take 1 tablet (10 mg total) by mouth daily.   cholecalciferol (VITAMIN D3) 25 MCG (1000 UT) tablet Take 1,000 Units by mouth daily.   cyclobenzaprine  (FLEXERIL ) 10 MG tablet Take 1 tablet (10 mg total) by mouth 3 (three) times daily as needed for muscle spasms.   Homeopathic Products (THERAWORX RELIEF) FOAM Apply 1 application  topically 2 (two) times daily.   ibuprofen  (ADVIL ) 800 MG tablet  Take 1 tablet (800 mg total) by mouth every 8 (eight) hours as needed for moderate pain (pain score 4-6). Most last 7 days.   meloxicam  (MOBIC ) 7.5 MG tablet Take 1 tablet (7.5 mg total) by mouth daily.   traMADol  (ULTRAM ) 50 MG tablet Take 1 tablet (50 mg total) by mouth every 12 (twelve) hours as needed.   triamcinolone  ointment (KENALOG ) 0.5 % Apply 1 Application topically 2 (two) times daily.   [DISCONTINUED] diclofenac  Sodium (VOLTAREN ) 1 % GEL Apply 2 g topically 4 (four) times daily.   [DISCONTINUED] docusate sodium  (STOOL SOFTENER) 100 MG capsule Take 1 capsule (100 mg total) by mouth 2 (two) times daily.   amoxicillin -clavulanate (AUGMENTIN ) 875-125 MG tablet Take 1 tablet by mouth 2 (two) times daily.   ciclopirox  (PENLAC ) 8 % solution Apply topically at bedtime. Apply over nail and surrounding skin. Apply daily over previous coat. After seven (7) days, may remove with alcohol and continue cycle. (Patient not taking: Reported on 05/05/2024)   diclofenac  Sodium (VOLTAREN ) 1 % GEL Apply 2 g topically 4 (four) times daily.   docusate sodium  (STOOL SOFTENER) 100 MG capsule Take 1 capsule (100 mg total) by mouth 2 (two) times daily.   doxycycline  (VIBRA -TABS) 100 MG tablet Take 1 tablet (100 mg total) by mouth 2 (two) times daily. (Patient not taking: Reported on 08/10/2023)   lidocaine  (HM LIDOCAINE  PATCH) 4 % Place 1 patch onto the skin daily. (Patient not taking: Reported on 05/05/2024)   montelukast  (SINGULAIR ) 10 MG tablet Take 1 tablet (  10 mg total) by mouth at bedtime. (Patient not taking: Reported on 05/05/2024)   predniSONE  (DELTASONE ) 10 MG tablet Take 4 tabs for 2 days, then 3 tabs for 2 days, then 2 tabs for 2 days, then 1 tab for 2 days, then stop (Patient not taking: Reported on 10/10/2022)   predniSONE  (STERAPRED UNI-PAK 21 TAB) 10 MG (21) TBPK tablet Take as directed (Patient not taking: Reported on 10/10/2022)   pseudoephedrine  (SUDAFED) 30 MG tablet Take 1 tablet (30 mg total) by  mouth every 8 (eight) hours as needed for congestion. (Patient not taking: Reported on 05/05/2024)   No facility-administered encounter medications on file as of 05/05/2024.    Review of Systems  Review of Systems  Constitutional: Negative.   HENT: Negative.    Cardiovascular: Negative.   Gastrointestinal: Negative.   Allergic/Immunologic: Negative.   Neurological: Negative.   Psychiatric/Behavioral: Negative.       Objective:   BP 120/74   Pulse 70   Wt 165 lb (74.8 kg)   SpO2 100%   BMI 25.84 kg/m   Wt Readings from Last 5 Encounters:  05/05/24 165 lb (74.8 kg)  11/03/23 166 lb 6.4 oz (75.5 kg)  08/10/23 170 lb 6.4 oz (77.3 kg)  08/06/23 169 lb (76.7 kg)  10/10/22 161 lb 6.4 oz (73.2 kg)     Physical Exam Vitals and nursing note reviewed.  Constitutional:      General: She is not in acute distress.    Appearance: She is well-developed.  Cardiovascular:     Rate and Rhythm: Normal rate and regular rhythm.  Pulmonary:     Effort: Pulmonary effort is normal.     Breath sounds: Normal breath sounds.  Neurological:     Mental Status: She is alert and oriented to person, place, and time.       Assessment & Plan:   Lipid screening -     Lipid panel  Chronic left shoulder pain -     Diclofenac  Sodium; Apply 2 g topically 4 (four) times daily.  Dispense: 2 g; Refill: 0  Adhesive capsulitis of left shoulder -     Diclofenac  Sodium; Apply 2 g topically 4 (four) times daily.  Dispense: 2 g; Refill: 0  Low hemoglobin -     CBC -     Comprehensive metabolic panel with GFR -     Iron, TIBC and Ferritin Panel  Constipation, unspecified constipation type -     Docusate Sodium ; Take 1 capsule (100 mg total) by mouth 2 (two) times daily.  Dispense: 10 capsule; Refill: 0     Return in about 6 months (around 11/02/2024).   Bascom GORMAN Borer, NP 05/05/2024

## 2024-05-06 ENCOUNTER — Ambulatory Visit: Payer: Self-pay | Admitting: Nurse Practitioner

## 2024-05-06 LAB — COMPREHENSIVE METABOLIC PANEL WITH GFR
ALT: 12 IU/L (ref 0–32)
AST: 20 IU/L (ref 0–40)
Albumin: 4.5 g/dL (ref 3.9–4.9)
Alkaline Phosphatase: 54 IU/L (ref 41–116)
BUN/Creatinine Ratio: 28 — ABNORMAL HIGH (ref 9–23)
BUN: 20 mg/dL (ref 6–24)
Bilirubin Total: 0.2 mg/dL (ref 0.0–1.2)
CO2: 24 mmol/L (ref 20–29)
Calcium: 9.2 mg/dL (ref 8.7–10.2)
Chloride: 102 mmol/L (ref 96–106)
Creatinine, Ser: 0.71 mg/dL (ref 0.57–1.00)
Globulin, Total: 2.4 g/dL (ref 1.5–4.5)
Glucose: 92 mg/dL (ref 70–99)
Potassium: 4 mmol/L (ref 3.5–5.2)
Sodium: 138 mmol/L (ref 134–144)
Total Protein: 6.9 g/dL (ref 6.0–8.5)
eGFR: 104 mL/min/1.73 (ref >59)

## 2024-05-06 LAB — CBC
Hematocrit: 35.1 % (ref 34.0–46.6)
Hemoglobin: 11.1 g/dL (ref 11.1–15.9)
MCH: 26.8 pg (ref 26.6–33.0)
MCHC: 31.6 g/dL (ref 31.5–35.7)
MCV: 85 fL (ref 79–97)
Platelets: 316 x10E3/uL (ref 150–450)
RBC: 4.14 x10E6/uL (ref 3.77–5.28)
RDW: 13 % (ref 11.7–15.4)
WBC: 4 x10E3/uL (ref 3.4–10.8)

## 2024-05-06 LAB — IRON,TIBC AND FERRITIN PANEL
Ferritin: 106 ng/mL (ref 15–150)
Iron Saturation: 22 % (ref 15–55)
Iron: 83 ug/dL (ref 27–159)
Total Iron Binding Capacity: 372 ug/dL (ref 250–450)
UIBC: 289 ug/dL (ref 131–425)

## 2024-05-06 LAB — LIPID PANEL
Chol/HDL Ratio: 2 ratio (ref 0.0–4.4)
Cholesterol, Total: 223 mg/dL — ABNORMAL HIGH (ref 100–199)
HDL: 113 mg/dL (ref >39)
LDL Chol Calc (NIH): 98 mg/dL (ref 0–99)
Triglycerides: 68 mg/dL (ref 0–149)
VLDL Cholesterol Cal: 12 mg/dL (ref 5–40)

## 2024-05-31 ENCOUNTER — Ambulatory Visit: Payer: Self-pay

## 2024-05-31 NOTE — Telephone Encounter (Signed)
 FYI Only or Action Required?: Action required by provider: request for appointment.  Patient was last seen in primary care on 05/05/2024 by Oley Bascom RAMAN, NP.  Called Nurse Triage reporting Foot Swelling.  Symptoms began a week ago.  Interventions attempted: Nothing.  Symptoms are: gradually worsening.  Triage Disposition: See PCP When Office is Open (Within 3 Days)  Patient/caregiver understands and will follow disposition?: No, wishes to speak with PCP   Copied from CRM #[id number]. Topic: Clinical - Red Word Triage >> May 31, 2024  4:06 PM Harlene ORN wrote: Red Word that prompted transfer to Nurse Triage: feet swelling for two weeks Requesting referral for podiatrist Reason for Disposition  [1] MODERATE pain (e.g., interferes with normal activities, limping) AND [2] present > 3 days  Answer Assessment - Initial Assessment Questions 1. ONSET: When did the pain start?      X 2 months and worsening 2. LOCATION: Where is the pain located?      Left foot 3. PAIN: How bad is the pain?    (Scale 1-10; or mild, moderate, severe)     Mild to severe 4. WORK OR EXERCISE: Has there been any recent work or exercise that involved this part of the body?      New Work shoes used on foot that were uncomfortable - pt recently stopped wearing them  5. CAUSE: What do you think is causing the foot pain?     unsure 6. OTHER SYMPTOMS: Do you have any other symptoms? (e.g., leg pain, rash, fever, numbness)     no 7. PREGNANCY: Is there any chance you are pregnant? When was your last menstrual period?     na  If up walking a lot swells and becomes more painful: feels like walking on a sore or tennis ball. Pt has a spot on foot that looks that possible bruise,  pt would like to see if can be worked in to PCP scheduled or referred to podiatrist due to walking has become uncomfortable.  Please call patient with updated plan of care.    No openings on PCP schedule until 06/2024: pt would  like to be worked into scheduled r/t foot .  Please call pt  Protocols used: Foot Pain-A-AH

## 2024-06-03 NOTE — Telephone Encounter (Signed)
 Please advise if you will place referral for swelling of pt feet for two weeks or will she need to be seen again. KH

## 2024-06-06 NOTE — Telephone Encounter (Signed)
 Per pt she is doing better. Pt was advised to elevate and watch salt consumption. kh

## 2024-07-19 ENCOUNTER — Ambulatory Visit (INDEPENDENT_AMBULATORY_CARE_PROVIDER_SITE_OTHER): Admitting: Nurse Practitioner

## 2024-07-19 ENCOUNTER — Encounter: Payer: Self-pay | Admitting: Nurse Practitioner

## 2024-07-19 VITALS — BP 127/72 | HR 82 | Wt 162.0 lb

## 2024-07-19 DIAGNOSIS — F419 Anxiety disorder, unspecified: Secondary | ICD-10-CM | POA: Insufficient documentation

## 2024-07-19 DIAGNOSIS — F322 Major depressive disorder, single episode, severe without psychotic features: Secondary | ICD-10-CM | POA: Insufficient documentation

## 2024-07-19 DIAGNOSIS — F4321 Adjustment disorder with depressed mood: Secondary | ICD-10-CM | POA: Diagnosis not present

## 2024-07-19 DIAGNOSIS — Z634 Disappearance and death of family member: Secondary | ICD-10-CM

## 2024-07-19 MED ORDER — ESCITALOPRAM OXALATE 10 MG PO TABS: 10.0000 mg | ORAL_TABLET | Freq: Every day | ORAL | 1 refills | Status: AC

## 2024-07-19 MED ORDER — HYDROXYZINE PAMOATE 25 MG PO CAPS: 25.0000 mg | ORAL_CAPSULE | Freq: Three times a day (TID) | ORAL | 1 refills | Status: AC | PRN

## 2024-07-19 NOTE — Assessment & Plan Note (Signed)
 Emotional support provided Declined referral for grief counseling

## 2024-07-19 NOTE — Assessment & Plan Note (Addendum)
" °    07/19/2024    2:56 PM 05/05/2024    8:28 AM 08/10/2023    3:44 PM  GAD 7 : Generalized Anxiety Score  Nervous, Anxious, on Edge 3 0  0   Control/stop worrying 3 0  0   Worry too much - different things 0 0    Trouble relaxing 3 0    Restless 3 0    Easily annoyed or irritable 3 0    Afraid - awful might happen 0 0    Total GAD 7 Score 15 0   Anxiety Difficulty Extremely difficult Not difficult at all      Data saved with a previous flowsheet row definition    Major depressive disorder with anxiety Experiencing significant grief and depression following the recent loss of her son. Reports daily anxiety, trouble relaxing, and difficulty sleeping, consistent with major depressive disorder with anxiety. Prefers medication over therapy. - Prescribed hydroxyzine  25 mg three times daily as needed for anxiety, monitor for drowsiness - Prescribed Lexapro  10 mg daily for depression. "

## 2024-07-19 NOTE — Assessment & Plan Note (Signed)
" °    07/19/2024    2:57 PM 05/05/2024    8:28 AM 08/10/2023    3:43 PM  Depression screen PHQ 2/9  Decreased Interest 3 0 0  Down, Depressed, Hopeless 3 0 0  PHQ - 2 Score 6 0 0  Altered sleeping 3 0   Tired, decreased energy 3 0   Change in appetite 3 0   Feeling bad or failure about yourself  0 0   Trouble concentrating 1 0   Moving slowly or fidgety/restless 3 0   Suicidal thoughts 0 0   PHQ-9 Score 19 0    Difficult doing work/chores Extremely dIfficult Not difficult at all      Data saved with a previous flowsheet row definition   Major depressive disorder with anxiety Experiencing significant grief and depression following the recent loss of her son. Reports daily anxiety, trouble relaxing, and difficulty sleeping, consistent with major depressive disorder with anxiety. Prefers medication over therapy. - Prescribed hydroxyzine  25 mg three times daily as needed for anxiety. - Prescribed Lexapro  10 mg daily for depression. Follow-up in 6 weeks Work note provided to return to work after August 01, 2024 "

## 2024-07-19 NOTE — Progress Notes (Signed)
 "  Acute Office Visit  Subjective:     Patient ID: Melanie Ortiz, female    DOB: Dec 26, 1975, 49 y.o.   MRN: [id number]  Chief Complaint  Patient presents with   Anxiety    HPI   Discussed the use of AI scribe software for clinical note transcription with the patient, who gave verbal consent to proceed.  History of Present Illness Melanie Ortiz is a 48 year old female  has a past medical history of Allergic reaction, Anemia, Asthma, COVID, Facial swelling, and Neuromuscular disorder (HCC) (2015). who presents with depression and anxiety following the recent loss of her son.  She is experiencing overwhelming depression and anxiety following the death of her 58 year old son, who was killed on January 14th. She is seeking medication to help manage her symptoms and requires a doctor's note to take time off work to arrange her son's funeral, scheduled for January 31st.  She has trouble relaxing nearly every day over the past two weeks, along with difficulty eating and sleeping. She is currently not taking any medication for depression or anxiety   Her family is also struggling with the loss, and her daughter has been traveling back and forth to support her. She is in the process of making funeral arrangements, which have been delayed due to scheduling issues with the funeral home.  Assessment & Plan     Review of Systems  Constitutional:  Negative for appetite change, chills, fatigue and fever.  HENT:  Negative for congestion, postnasal drip, rhinorrhea and sneezing.   Respiratory:  Negative for cough, shortness of breath and wheezing.   Cardiovascular:  Negative for chest pain, palpitations and leg swelling.  Gastrointestinal:  Negative for abdominal pain, constipation, nausea and vomiting.  Genitourinary:  Negative for difficulty urinating, dysuria, flank pain and frequency.  Musculoskeletal:  Negative for arthralgias, back pain, joint swelling and myalgias.  Skin:  Negative for  color change, pallor, rash and wound.  Neurological:  Negative for dizziness, facial asymmetry, weakness, numbness and headaches.  Psychiatric/Behavioral:  Positive for sleep disturbance. Negative for behavioral problems, confusion, self-injury and suicidal ideas. The patient is nervous/anxious.         Objective:    BP 127/72   Pulse 82   Wt 162 lb (73.5 kg)   SpO2 100%   BMI 25.37 kg/m    Physical Exam Vitals and nursing note reviewed.  Constitutional:      General: She is not in acute distress.    Appearance: Normal appearance. She is not ill-appearing, toxic-appearing or diaphoretic.  Eyes:     General: No scleral icterus.       Right eye: No discharge.        Left eye: No discharge.     Extraocular Movements: Extraocular movements intact.     Conjunctiva/sclera: Conjunctivae normal.  Cardiovascular:     Rate and Rhythm: Normal rate and regular rhythm.     Pulses: Normal pulses.     Heart sounds: Normal heart sounds. No murmur heard.    No friction rub. No gallop.  Pulmonary:     Effort: Pulmonary effort is normal. No respiratory distress.     Breath sounds: Normal breath sounds. No stridor. No wheezing, rhonchi or rales.  Chest:     Chest wall: No tenderness.  Abdominal:     General: There is no distension.     Palpations: Abdomen is soft.     Tenderness: There is no abdominal tenderness. There is no right  CVA tenderness, left CVA tenderness or guarding.  Musculoskeletal:        General: No swelling, tenderness, deformity or signs of injury.     Right lower leg: No edema.     Left lower leg: No edema.  Skin:    General: Skin is warm and dry.     Capillary Refill: Capillary refill takes less than 2 seconds.     Coloration: Skin is not jaundiced or pale.     Findings: No bruising, erythema or lesion.  Neurological:     Mental Status: She is alert and oriented to person, place, and time.     Motor: No weakness.     Coordination: Coordination normal.     Gait:  Gait normal.  Psychiatric:        Mood and Affect: Mood normal.        Behavior: Behavior normal.        Thought Content: Thought content normal.        Judgment: Judgment normal.     Comments: Patient is tearful     No results found for any visits on 07/19/24.      Assessment & Plan:   Problem List Items Addressed This Visit       Other   Grief at loss of child - Primary   Emotional support provided Declined referral for grief counseling      Anxiety      07/19/2024    2:56 PM 05/05/2024    8:28 AM 08/10/2023    3:44 PM  GAD 7 : Generalized Anxiety Score  Nervous, Anxious, on Edge 3 0  0   Control/stop worrying 3 0  0   Worry too much - different things 0 0    Trouble relaxing 3 0    Restless 3 0    Easily annoyed or irritable 3 0    Afraid - awful might happen 0 0    Total GAD 7 Score 15 0   Anxiety Difficulty Extremely difficult Not difficult at all      Data saved with a previous flowsheet row definition    Major depressive disorder with anxiety Experiencing significant grief and depression following the recent loss of her son. Reports daily anxiety, trouble relaxing, and difficulty sleeping, consistent with major depressive disorder with anxiety. Prefers medication over therapy. - Prescribed hydroxyzine  25 mg three times daily as needed for anxiety, monitor for drowsiness - Prescribed Lexapro  10 mg daily for depression.      Relevant Medications   escitalopram  (LEXAPRO ) 10 MG tablet   hydrOXYzine  (VISTARIL ) 25 MG capsule   Current severe episode of major depressive disorder without psychotic features without prior episode (HCC)      07/19/2024    2:57 PM 05/05/2024    8:28 AM 08/10/2023    3:43 PM  Depression screen PHQ 2/9  Decreased Interest 3 0 0  Down, Depressed, Hopeless 3 0 0  PHQ - 2 Score 6 0 0  Altered sleeping 3 0   Tired, decreased energy 3 0   Change in appetite 3 0   Feeling bad or failure about yourself  0 0   Trouble concentrating 1 0    Moving slowly or fidgety/restless 3 0   Suicidal thoughts 0 0   PHQ-9 Score 19 0    Difficult doing work/chores Extremely dIfficult Not difficult at all      Data saved with a previous flowsheet row definition   Major depressive disorder with anxiety  Experiencing significant grief and depression following the recent loss of her son. Reports daily anxiety, trouble relaxing, and difficulty sleeping, consistent with major depressive disorder with anxiety. Prefers medication over therapy. - Prescribed hydroxyzine  25 mg three times daily as needed for anxiety. - Prescribed Lexapro  10 mg daily for depression. Follow-up in 6 weeks Work note provided to return to work after August 01, 2024      Relevant Medications   escitalopram  (LEXAPRO ) 10 MG tablet   hydrOXYzine  (VISTARIL ) 25 MG capsule    Meds ordered this encounter  Medications   escitalopram  (LEXAPRO ) 10 MG tablet    Sig: Take 1 tablet (10 mg total) by mouth daily.    Dispense:  60 tablet    Refill:  1   hydrOXYzine  (VISTARIL ) 25 MG capsule    Sig: Take 1 capsule (25 mg total) by mouth every 8 (eight) hours as needed.    Dispense:  30 capsule    Refill:  1    Return in about 6 weeks (around 08/30/2024) for ANXIETY, DEPRESSION.  Jamarius Saha R Ayris Carano, FNP  "

## 2024-07-19 NOTE — Patient Instructions (Signed)
 1. Grief at loss of child (Primary)  2. Anxiety - hydrOXYzine  (VISTARIL ) 25 MG capsule; Take 1 capsule (25 mg total) by mouth every 8 (eight) hours as needed.  Dispense: 30 capsule; Refill: 1  3. Current severe episode of major depressive disorder without psychotic features without prior episode (HCC) - escitalopram  (LEXAPRO ) 10 MG tablet; Take 1 tablet (10 mg total) by mouth daily.  Dispense: 60 tablet; Refill: 1 - hydrOXYzine  (VISTARIL ) 25 MG capsule; Take 1 capsule (25 mg total) by mouth every 8 (eight) hours as needed.  Dispense: 30 capsule; Refill: 1   It is important that you exercise regularly at least 30 minutes 5 times a week as tolerated  Think about what you will eat, plan ahead. Choose  clean, green, fresh or frozen over canned, processed or packaged foods which are more sugary, salty and fatty. 70 to 75% of food eaten should be vegetables and fruit. Three meals at set times with snacks allowed between meals, but they must be fruit or vegetables. Aim to eat over a 12 hour period , example 7 am to 7 pm, and STOP after  your last meal of the day. Drink water,generally about 64 ounces per day, no other drink is as healthy. Fruit juice is best enjoyed in a healthy way, by EATING the fruit.  Thanks for choosing [facility] we consider it a privelige to serve you.

## 2024-09-02 ENCOUNTER — Ambulatory Visit: Payer: Self-pay | Admitting: Nurse Practitioner

## 2024-11-04 ENCOUNTER — Ambulatory Visit: Payer: Self-pay | Admitting: Nurse Practitioner
